# Patient Record
Sex: Female | Born: 1970 | Race: Black or African American | Hispanic: No | Marital: Single | Smoking: Never smoker
Health system: Southern US, Community
[De-identification: ages and names within clinical notes are randomized; demographics above are authoritative.]

## PROBLEM LIST (undated history)

## (undated) DIAGNOSIS — Z5189 Encounter for other specified aftercare: Secondary | ICD-10-CM

## (undated) DIAGNOSIS — I10 Essential (primary) hypertension: Secondary | ICD-10-CM

---

## 1990-11-07 DIAGNOSIS — Z5189 Encounter for other specified aftercare: Secondary | ICD-10-CM

## 1990-11-07 HISTORY — PX: DILATION AND CURETTAGE OF UTERUS: SHX78

## 1990-11-07 HISTORY — DX: Encounter for other specified aftercare: Z51.89

## 1998-04-16 ENCOUNTER — Emergency Department (HOSPITAL_COMMUNITY): Admission: EM | Admit: 1998-04-16 | Discharge: 1998-04-16 | Payer: Self-pay | Admitting: Emergency Medicine

## 1998-06-30 ENCOUNTER — Emergency Department (HOSPITAL_COMMUNITY): Admission: EM | Admit: 1998-06-30 | Discharge: 1998-06-30 | Payer: Self-pay | Admitting: Emergency Medicine

## 1999-04-06 ENCOUNTER — Emergency Department (HOSPITAL_COMMUNITY): Admission: EM | Admit: 1999-04-06 | Discharge: 1999-04-06 | Payer: Self-pay | Admitting: Emergency Medicine

## 1999-06-03 ENCOUNTER — Other Ambulatory Visit: Admission: RE | Admit: 1999-06-03 | Discharge: 1999-06-03 | Payer: Self-pay | Admitting: Obstetrics

## 1999-11-07 ENCOUNTER — Inpatient Hospital Stay (HOSPITAL_COMMUNITY): Admission: AD | Admit: 1999-11-07 | Discharge: 1999-11-07 | Payer: Self-pay | Admitting: Obstetrics

## 2000-01-12 ENCOUNTER — Inpatient Hospital Stay (HOSPITAL_COMMUNITY): Admission: AD | Admit: 2000-01-12 | Discharge: 2000-01-15 | Payer: Self-pay | Admitting: Obstetrics

## 2000-01-12 ENCOUNTER — Encounter (INDEPENDENT_AMBULATORY_CARE_PROVIDER_SITE_OTHER): Payer: Self-pay | Admitting: Specialist

## 2003-09-01 ENCOUNTER — Other Ambulatory Visit: Admission: RE | Admit: 2003-09-01 | Discharge: 2003-09-01 | Payer: Self-pay | Admitting: Gynecology

## 2009-02-16 ENCOUNTER — Emergency Department: Payer: Self-pay | Admitting: Emergency Medicine

## 2009-02-20 ENCOUNTER — Emergency Department (HOSPITAL_COMMUNITY): Admission: EM | Admit: 2009-02-20 | Discharge: 2009-02-20 | Payer: Self-pay | Admitting: Family Medicine

## 2009-06-23 ENCOUNTER — Other Ambulatory Visit: Admission: RE | Admit: 2009-06-23 | Discharge: 2009-06-23 | Payer: Self-pay | Admitting: Obstetrics and Gynecology

## 2011-03-25 NOTE — H&P (Signed)
[facility] [place]  Patient:    SHARLETTE, JANSMA                        MRN: [id number] Adm. Date:  [id number] Attending:  Venita Sheffield                         History and Physical  HISTORY OF PRESENT ILLNESS:   The patient is a 40 year old gravida 4, para 2-0-1-2. She had a previous ectopic and had two previous cesarean sections.  Her EDC was  January 26, 2000 and she was admitted in early labor for repeat C-section and tubal ligation.  PHYSICAL EXAMINATION:  GENERAL:                      Physical exam revealed a well-developed female in  early labor.  HEENT:                        Negative.  LUNGS:                        Clear.  HEART:                        Regular rhythm.  No murmurs.  No gallops.  ABDOMEN:                      Term-size uterus.  Fetal heart 140.  PELVIC:                       The cervix was 1-cm long, vertex -3.  EXTREMITIES:                  Negative. DD:  01/13/00 TD:  01/13/00 Job: [id number]

## 2011-03-25 NOTE — Op Note (Signed)
[facility]  Patient:    Debra Vargas, Debra Vargas                        MRN: [id number] Proc. Date: 01/13/00 Adm. Date:  [id number] Attending:  Venita Sheffield                           Operative Report  PREOPERATIVE DIAGNOSES:       Previous cesarean section x 2; multiparity; at term, in labor, desiring repeat C-section and tubal ligation.   POSTOPERATIVE DIAGNOSES:  OPERATION:  SURGEON:                      Kathreen Cosier, M.D.  ANESTHESIA:                   Spinal.  DESCRIPTION OF PROCEDURE:     Patient was placed on the operating table in supine position after the spinal had been administered by Dr. Shela Commons. Leilani Able, Montez Hageman. Abdomen was prepped and draped and bladder emptied with a Foley catheter. Transverse suprapubic incision was made through the old scar, carried down to the rectus fascia, fascia cleaned and incised the length of the incision, rectus muscles retracted laterally and peritoneum incised longitudinally.  A transverse incision was made in the visceroperitoneum and the bladder mobilized inferiorly. Transverse lower uterine incision was made; the fluid was clear.  The patient was delivered from the LOA position of a female, Apgar 8/9, weighing 7 pounds 12 ounces.  The team was in attendance.  The placenta was anterior and removed manually.  Uterine cavity was cleaned with dry laps.  The uterine incision was closed in one layer with a continuous suture of #1 chromic interlocking; hemostasis was satisfactory.  Bladder flap was reattached with 2-0 chromic.  The ovaries were normal.  The right tube was present.  The left tube was noted to be absent.  In the midportion of the right tube, tube was grasped with a Babcock clamp and a 0 plain suture placed in the mesosalpinx below the portion of tube within the clamp. This was tied on both sides and approximately one inch of tube transected. Hemostasis was satisfactory.   Lap and sponge counts were correct.  Abdomen was closed in layers:  Peritoneum -- a continuous suture of 0 chromic, fascia -- continuous suture of 0 Dexon, and the skin closed with a subcuticular stitch of 3-0 plain.  Blood loss was 500 cc.  The patient tolerated the procedure well and taken to the recovery room in good condition. DD:  01/13/00 TD:  01/14/00 Job: [id number]

## 2011-03-25 NOTE — Discharge Summary (Signed)
[facility] [place]  Patient:    Debra Vargas, Debra Vargas                        MRN: [id number] Adm. Date:  [id number] Disc. Date: [id number] Attending:  Venita Sheffield                           Discharge Summary  HISTORY OF PRESENT ILLNESS:   The patient is a 40 year old, gravida 4, para 2-0-1-2, who had two previous C-sections and an ectopic pregnancy in the past. The [facility] was January 26, 2000, and she was admitted in early labor.  It was decided that she would undergo a repeat C-section and tubal ligation.  HOSPITAL COURSE:              Using spinal anesthesia, the patient underwent a repeat low transverse cesarean section.  She had a female with Apgars of 8 and , weighing 7 pounds 12 ounces.  She had a right tubal pregnancy and a tubal ligation was performed on that side.  The left tube was noted to be absent. Postoperatively, she did well.  Her hemoglobin was 9.8.  DISPOSITION:                  She was discharged home on the secondary postoperative day, ambulatory and on a regular diet.  To see me in six weeks.  DISCHARGE DIAGNOSES:          1. Repeat low transverse cesarean section at term in                                  labor.                               2. Multiparity with tubal ligation. DD:  01/15/00 TD:  01/16/00 Job: [id number]

## 2012-11-07 HISTORY — PX: NOVASURE ABLATION: SHX5394

## 2012-11-28 ENCOUNTER — Other Ambulatory Visit: Payer: Self-pay | Admitting: Obstetrics and Gynecology

## 2012-11-28 DIAGNOSIS — N6310 Unspecified lump in the right breast, unspecified quadrant: Secondary | ICD-10-CM

## 2012-12-17 ENCOUNTER — Ambulatory Visit
Admission: RE | Admit: 2012-12-17 | Discharge: 2012-12-17 | Disposition: A | Discharge status: Home / Self Care | Payer: Commercial Managed Care - PPO | Source: Ambulatory Visit | Attending: Obstetrics and Gynecology | Admitting: Obstetrics and Gynecology

## 2012-12-17 DIAGNOSIS — N6310 Unspecified lump in the right breast, unspecified quadrant: Secondary | ICD-10-CM

## 2013-02-09 ENCOUNTER — Inpatient Hospital Stay (HOSPITAL_COMMUNITY)
Admission: EM | Admit: 2013-02-09 | Discharge: 2013-02-10 | Disposition: A | Discharge status: Home / Self Care | Payer: Commercial Managed Care - PPO | Attending: Obstetrics and Gynecology | Admitting: Obstetrics and Gynecology

## 2013-02-09 DIAGNOSIS — N898 Other specified noninflammatory disorders of vagina: Secondary | ICD-10-CM

## 2013-02-09 DIAGNOSIS — N938 Other specified abnormal uterine and vaginal bleeding: Secondary | ICD-10-CM | POA: Insufficient documentation

## 2013-02-09 DIAGNOSIS — N949 Unspecified condition associated with female genital organs and menstrual cycle: Secondary | ICD-10-CM | POA: Insufficient documentation

## 2013-02-09 DIAGNOSIS — R109 Unspecified abdominal pain: Secondary | ICD-10-CM | POA: Insufficient documentation

## 2013-02-09 DIAGNOSIS — N939 Abnormal uterine and vaginal bleeding, unspecified: Secondary | ICD-10-CM

## 2013-02-09 HISTORY — DX: Encounter for other specified aftercare: Z51.89

## 2013-02-09 LAB — CBC
Hemoglobin: 9.3 g/dL — ABNORMAL LOW (ref 12.0–15.0)
MCH: 21.1 pg — ABNORMAL LOW (ref 26.0–34.0)
MCV: 71 fL — ABNORMAL LOW (ref 78.0–100.0)
RBC: 4.41 MIL/uL (ref 3.87–5.11)

## 2013-02-09 MED ORDER — DEXTROSE 5 % IV SOLN
500.0000 mg | Freq: Once | INTRAVENOUS | Status: AC
  Administered 2013-02-09: 500 mg via INTRAVENOUS
  Filled 2013-02-09: qty 500

## 2013-02-09 MED ORDER — DEXTROSE 5 % IV SOLN
1.0000 g | Freq: Once | INTRAVENOUS | Status: AC
  Administered 2013-02-09: 1 g via INTRAVENOUS
  Filled 2013-02-09: qty 10

## 2013-02-09 MED ORDER — FENTANYL CITRATE 0.05 MG/ML IJ SOLN
100.0000 ug | Freq: Once | INTRAMUSCULAR | Status: AC
  Administered 2013-02-09: 100 ug via INTRAVENOUS
  Filled 2013-02-09: qty 2

## 2013-02-09 NOTE — ED Notes (Signed)
Pt states she had removal of her uterine lining 3 weeks ago and that last night she started cramping severely and today she is bleeding clots the size of her fist and is tearful during triage assessment,  Pt brought in by daughter,

## 2013-02-09 NOTE — ED Provider Notes (Signed)
History     CSN: [id number]  Arrival date & time 02/09/13  1935   None     Chief Complaint  Patient presents with  . Abdominal Pain  . Vaginal Bleeding    (Consider location/radiation/quality/duration/timing/severity/associated sxs/prior treatment) HPI Patient reports lower abdominal cramps that started last night and heavy vaginal bleeding with fist-sized clots starting today.  On 01/21/13 she had a NovaSure procedure done by Dr. Ellyn Hack of [facility] for heavy periods.  States that the current bleeding is much worse than what she had previously and she never had cramps this severe.  States LMP was around the time of the procedure.  She was doing well until yesterday.  Started having some dizziness a hour or so ago, but thinks this might be from the Norco she took at 1800.  Has also taken ibuprofen 600mg  with no improvement.  Denies chest pain or shortness of breath.  No nausea, vomiting, diarrhea.    No past medical history on file.  No past surgical history on file.  No family history on file.  History  Substance Use Topics  . Smoking status: Not on file  . Smokeless tobacco: Not on file  . Alcohol Use: Not on file    OB History   No data available      Review of Systems  Constitutional: Negative.   HENT: Negative.   Respiratory: Negative for shortness of breath.   Cardiovascular: Negative for chest pain.  Gastrointestinal: Positive for abdominal pain (lower abdominal cramping). Negative for nausea, vomiting and diarrhea.  Genitourinary: Positive for vaginal bleeding and pelvic pain.  Skin: Negative for color change.  Neurological: Positive for light-headedness.    Allergies  Review of patient's allergies indicates no known allergies.  Home Medications  No current outpatient prescriptions on file.  BP 145/79  Pulse 103  Temp(Src) 99.2 F (37.3 C) (Oral)  Resp 18  Ht 5\' 1"  (1.549 m)  Wt 178 lb (80.74 kg)  BMI 33.65 kg/m2  SpO2 100%  Physical Exam   Constitutional: She is oriented to person, place, and time. She appears well-developed and well-nourished. She appears distressed.  HENT:  Head: Normocephalic and atraumatic.  Eyes: Conjunctivae are normal.  Neck: Neck supple.  Cardiovascular: Regular rhythm, S1 normal, S2 normal and intact distal pulses.  Tachycardia present.   No murmur heard. Pulmonary/Chest: Effort normal and breath sounds normal. No respiratory distress. She has no wheezes. She has no rales.  Abdominal: Soft. Bowel sounds are normal. She exhibits no distension and no mass. There is tenderness (lower quadrants). There is no rebound and no guarding.  Genitourinary: There is no rash on the right labia. There is no rash on the left labia. Uterus is enlarged and tender. Cervix exhibits motion tenderness (very mild) and discharge (active bleeding). Right adnexum displays no tenderness. Left adnexum displays no tenderness. There is bleeding (vaginal vault full of blood with multiple large clots) around the vagina. No signs of injury around the vagina.  Neurological: She is alert and oriented to person, place, and time. She exhibits normal muscle tone.  Skin: Skin is warm. She is diaphoretic (very mild).    ED Course  Procedures (including critical care time)  Labs Reviewed  CBC - Abnormal; Notable for the following:    WBC 22.5 (*)    Hemoglobin 9.3 (*)    HCT 31.3 (*)    MCV 71.0 (*)    MCH 21.1 (*)    MCHC 29.7 (*)    RDW  17.4 (*)    All other components within normal limits   No results found.   No diagnosis found.    MDM  42 yo otherwise healthy woman presenting with lower abdominal cramping and vaginal bleeding, s/p endometrial ablation 3 weeks ago.  Hemodynamically stable and afebrile.  Will check urine pregnancy and cbc.    White count elevated to 22,000 and Hgb at 9.3 (no other value to compare too).  Pelvic exam shows significant bleeding and uterine tenderness.  Discussed with Dr. Jackelyn Knife on the  phone who recommended starting PO doxycycline and hydration.  After further discussion, will give rocephin 1g and azithromycin 500mg  IV and transfer to [facility] for further evaluation by OB/GYN.          Phebe Colla, MD 02/09/13 2147

## 2013-02-09 NOTE — ED Provider Notes (Signed)
I saw  the patient, reviewed the resident's note and I agree with the findings and plan.   .Face to face Exam:  General:  Awake HEENT:  Atraumatic Resp:  Normal effort Abd:  Nondistended Neuro:No focal weakness   Nelia Shi, MD 02/09/13 2148

## 2013-02-10 ENCOUNTER — Encounter (HOSPITAL_COMMUNITY): Payer: Self-pay

## 2013-02-10 MED ORDER — DOXYCYCLINE HYCLATE 100 MG PO CAPS: 100.0000 mg | ORAL_CAPSULE | Freq: Two times a day (BID) | ORAL | Status: DC

## 2013-02-10 NOTE — MAU Provider Note (Signed)
  History     CSN: [id number]  Arrival date and time: 02/09/13 4098   First Provider Initiated Contact with Patient 02/10/13 [phone]      Chief Complaint  Patient presents with  . Abdominal Pain  . Vaginal Bleeding   HPI  Pt is here after evaluation at [facility] for vaginal bleeding s/p [place] sure procedure.  Pt states feels better since IV fluids and bleeding has improved.  Evaluate pt per Dr. Jackelyn Knife.    Past Medical History  Diagnosis Date  . Blood transfusion without reported diagnosis 1992    after D&C for SAB    Past Surgical History  Procedure Laterality Date  . Novasure ablation  2014  . Dilation and curettage of uterus  1992    History reviewed. No pertinent family history.  History  Substance Use Topics  . Smoking status: Never Smoker   . Smokeless tobacco: Not on file  . Alcohol Use: Yes    Allergies: No Known Allergies  Prescriptions prior to admission  Medication Sig Dispense Refill  . HYDROcodone-acetaminophen (NORCO/VICODIN) 5-325 MG per tablet Take 1 tablet by mouth every 6 (six) hours as needed for pain.      Marland Kitchen ibuprofen (ADVIL,MOTRIN) 800 MG tablet Take 800 mg by mouth every 8 (eight) hours as needed for pain.        Review of Systems  Constitutional: Negative for fever and chills.  Cardiovascular: Negative.   Gastrointestinal: Positive for abdominal pain (cramping).  Genitourinary: Negative.        Vaginal bleeding  Neurological: Negative for dizziness and headaches.   Physical Exam   Blood pressure 124/66, pulse 97, temperature 98.5 F (36.9 C), temperature source Oral, resp. rate 16, height 5\' 1"  (1.549 m), weight 178 lb (80.74 kg), last menstrual period 01/09/2013, SpO2 99.00%.  Physical Exam  Constitutional: She is oriented to person, place, and time. She appears well-developed and well-nourished. No distress.  HENT:  Head: Normocephalic.  Neck: Normal range of motion. Neck supple.  Cardiovascular: Normal rate, regular rhythm and  normal heart sounds.   Respiratory: Effort normal and breath sounds normal.  GI: Soft. She exhibits no mass. There is tenderness (mild). There is no guarding.  Genitourinary: There is bleeding (negative clots; moderate, no brisk bleeding) around the vagina.  Musculoskeletal: Normal range of motion.  Neurological: She is alert and oriented to person, place, and time. She has normal reflexes.  Skin: Skin is warm and dry.    MAU Course  Procedures Consulted with Dr. Jackelyn Knife > provide reassurance to pt, RX Doxycycline, may take OTC iron and follow-up with Dr. Ellyn Hack Monday, no further imaging indicated.    Assessment and Plan  Vaginal Bleeding - Stable  Plan: DC to home RX Doxycycline Follow-up on Monday with Dr. Ellyn Hack Call the office if bleeding and pain worsens.    [facility] 02/10/2013, 12:34 AM

## 2013-02-11 LAB — GC/CHLAMYDIA PROBE AMP
CT Probe RNA: NEGATIVE
GC Probe RNA: NEGATIVE

## 2013-06-05 ENCOUNTER — Emergency Department (HOSPITAL_COMMUNITY)
Admission: EM | Admit: 2013-06-05 | Discharge: 2013-06-05 | Disposition: A | Discharge status: Home / Self Care | Payer: Self-pay | Attending: Emergency Medicine | Admitting: Emergency Medicine

## 2013-06-05 DIAGNOSIS — I10 Essential (primary) hypertension: Secondary | ICD-10-CM

## 2013-06-05 DIAGNOSIS — R6883 Chills (without fever): Secondary | ICD-10-CM | POA: Insufficient documentation

## 2013-06-05 DIAGNOSIS — J3489 Other specified disorders of nose and nasal sinuses: Secondary | ICD-10-CM | POA: Insufficient documentation

## 2013-06-05 DIAGNOSIS — R509 Fever, unspecified: Secondary | ICD-10-CM | POA: Insufficient documentation

## 2013-06-05 DIAGNOSIS — B349 Viral infection, unspecified: Secondary | ICD-10-CM

## 2013-06-05 DIAGNOSIS — J069 Acute upper respiratory infection, unspecified: Secondary | ICD-10-CM

## 2013-06-05 DIAGNOSIS — R490 Dysphonia: Secondary | ICD-10-CM | POA: Insufficient documentation

## 2013-06-05 DIAGNOSIS — B9789 Other viral agents as the cause of diseases classified elsewhere: Secondary | ICD-10-CM | POA: Insufficient documentation

## 2013-06-05 LAB — RAPID STREP SCREEN (MED CTR MEBANE ONLY): Streptococcus, Group A Screen (Direct): NEGATIVE

## 2013-06-05 MED ORDER — HYDROCHLOROTHIAZIDE 25 MG PO TABS: 12.5000 mg | ORAL_TABLET | Freq: Every day | ORAL | Status: DC

## 2013-06-05 MED ORDER — ACETAMINOPHEN 325 MG PO TABS
650.0000 mg | ORAL_TABLET | Freq: Once | ORAL | Status: AC
  Administered 2013-06-05: 650 mg via ORAL
  Filled 2013-06-05: qty 2

## 2013-06-05 NOTE — ED Provider Notes (Signed)
CSN: [id number]     Arrival date & time 06/05/13  1604 History    This chart was scribed for Rolan Bucco, MD by Quintella Reichert, ED scribe.  This patient was seen in room WTR9/WTR9 and the patient's care was started at 4:35 PM.     Chief Complaint  Patient presents with  . Sore Throat  . Cough    The history is provided by the patient. No language interpreter was used.    HPI Comments: Debra Vargas is a 42 y.o. female who presents to the Emergency Department complaining of a progressively-worsening sore throat that began one week ago, with accompanying dry cough.  Pt states that she experiences a burning pain when she coughs.  When she does not cough she states "it doesn't hurt but it feels like it's on fire."  Pain is not greatly exacerbated by swallowing and she denies difficulty swallowing.  She notes that cough drops did provide some relief earlier but have since stopped being effective.  She has not taken any pain medication pta.  She also notes that her voice has been slightly raspy and complains of intermittent chills and some mild congestion in her ears and nose.  Upon arrival she presents with a temperature of 100 F.  She denies throat or neck swelling, difficulty breathing, CP, SOB, neck pain, or neck stiffness.  She denies recent sick contact.   Past Medical History  Diagnosis Date  . Blood transfusion without reported diagnosis 1992    after D&C for SAB    Past Surgical History  Procedure Laterality Date  . Novasure ablation  2014  . Dilation and curettage of uterus  1992    No family history on file.   History  Substance Use Topics  . Smoking status: Never Smoker   . Smokeless tobacco: Not on file  . Alcohol Use: Yes    OB History   Grav Para Term Preterm Abortions TAB SAB Ect Mult Living   4 3 3  1  1   3        Review of Systems  Constitutional: Positive for chills.  HENT: Positive for congestion, sore throat and voice change. Negative for facial  swelling, trouble swallowing, neck pain and neck stiffness.   Respiratory: Positive for cough. Negative for shortness of breath.   Cardiovascular: Negative for chest pain.  All other systems reviewed and are negative.      Allergies  Review of patient's allergies indicates no known allergies.  Home Medications   Current Outpatient Rx  Name  Route  Sig  Dispense  Refill  . doxycycline (VIBRAMYCIN) 100 MG capsule   Oral   Take 1 capsule (100 mg total) by mouth 2 (two) times daily.   14 capsule   0   . hydrochlorothiazide (HYDRODIURIL) 25 MG tablet   Oral   Take 0.5 tablets (12.5 mg total) by mouth daily.   15 tablet   0   . HYDROcodone-acetaminophen (NORCO/VICODIN) 5-325 MG per tablet   Oral   Take 1 tablet by mouth every 6 (six) hours as needed for pain.         Marland Kitchen ibuprofen (ADVIL,MOTRIN) 800 MG tablet   Oral   Take 800 mg by mouth every 8 (eight) hours as needed for pain.          BP 164/97  Pulse 106  Temp(Src) 100 F (37.8 C) (Oral)  SpO2 100%  Physical Exam  Nursing note and vitals reviewed. Constitutional: She  is oriented to person, place, and time. She appears well-developed and well-nourished. No distress.  HENT:  Head: Normocephalic and atraumatic.  Right Ear: Tympanic membrane and external ear normal.  Left Ear: Tympanic membrane and external ear normal.  Mouth/Throat: Uvula is midline and mucous membranes are normal. Posterior oropharyngeal erythema (Mild) present. No oropharyngeal exudate.  Negative facial swelling noted Negative pain upon palpation to the facial pain   Uvula midline, symmetrical elevation Negative petechiae, exudate noted to posterior oropharynx and tonsils bilaterally  Eyes: Conjunctivae and EOM are normal. Pupils are equal, round, and reactive to light. Right eye exhibits no discharge. Left eye exhibits no discharge.  Neck: Normal range of motion. Neck supple. No tracheal deviation present.  Negative neck stiffness Negative  nuchal rigidity Negative mid-cervical spine tenderness Negative LAD Negative masses palpated  Cardiovascular: Normal rate, regular rhythm and normal heart sounds.   No murmur heard. Pulses:      Radial pulses are 2+ on the right side, and 2+ on the left side.  Pulmonary/Chest: Effort normal and breath sounds normal. No respiratory distress. She has no wheezes. She has no rales.  Musculoskeletal: Normal range of motion.  Lymphadenopathy:    She has no cervical adenopathy.  Neurological: She is alert and oriented to person, place, and time. She exhibits normal muscle tone. Coordination normal.  Skin: Skin is warm and dry. No rash noted. No erythema.  Psychiatric: She has a normal mood and affect. Her behavior is normal.    ED Course  Procedures (including critical care time)  DIAGNOSTIC STUDIES: Oxygen Saturation is 100% on room air, normal by my interpretation.    COORDINATION OF CARE: 4:43 PM-Discussed treatment plan which includes strep screen with pt at bedside and pt agreed to plan.   Patient reported that she has had borderline elevated blood pressure, but was never started on medication.   Labs Reviewed  RAPID STREP SCREEN  CULTURE, GROUP A STREP    No results found.  1. Viral illness   2. URI (upper respiratory infection)   3. HTN (hypertension)      MDM  I personally performed the services described in this documentation, which was scribed in my presence. The recorded information has been reviewed and is accurate.  Patient presenting to the ED with sore throat and dry cough with mild nasal congestion that has been ongoing for the past week.  Mild erythema noted to the posterior oropharynx - negative exudate, petchiae, and tonsillar swelling noted. Doubt peritonsillar abscess. Negative masses palpated to the neck, negative LAD. Rapid strep negative. Negative meningeal signs - doubt meningitis. Suspicion to be viral in nature. Blood pressure monitored in ED setting -  increased diastolic, patient asymptomatic - denied blurred vision, headache, nausea, tingling, numbness. Patient stable, fever controlled in ED setting. Suspicion to be viral illness in nature. Discharged patient - discussed supportive therapy. Discharged patient with small dose HCTZ 12.5 mg - discussed with patient to take medication daily and discussed side affects to discontinue. Recommended patient to get blood pressure re-[facility] - gave referral. Discussed with patient DASH diet. Discussed with patient to continue to monitor symptoms and if symptoms are to worsen or change to report back to the ED - strict return instructions given. Patient agreed to plan of care, understood, all questions answered.   Raymon Mutton, PA-C 06/05/13 2200

## 2013-06-05 NOTE — ED Provider Notes (Signed)
Medical screening examination/treatment/procedure(s) were performed by non-physician practitioner and as supervising physician I was immediately available for consultation/collaboration.   Rolan Bucco, MD 06/05/13 [phone]

## 2013-06-05 NOTE — ED Notes (Addendum)
Marissa, PA notified of high BP.

## 2013-06-05 NOTE — ED Notes (Signed)
Pt states that she has had a sore throat and nonproductive cough x 1 wk. Thought it was getting better and it came back.

## 2013-06-07 LAB — CULTURE, GROUP A STREP

## 2013-06-17 ENCOUNTER — Encounter: Payer: Self-pay | Admitting: Internal Medicine

## 2013-06-17 ENCOUNTER — Ambulatory Visit: Payer: Self-pay | Attending: Family Medicine | Admitting: Internal Medicine

## 2013-06-17 VITALS — BP 143/86 | HR 79 | Temp 98.8°F | Ht 61.0 in | Wt 185.8 lb

## 2013-06-17 DIAGNOSIS — J329 Chronic sinusitis, unspecified: Secondary | ICD-10-CM | POA: Insufficient documentation

## 2013-06-17 DIAGNOSIS — I10 Essential (primary) hypertension: Secondary | ICD-10-CM | POA: Insufficient documentation

## 2013-06-17 LAB — LIPID PANEL: HDL: 47 mg/dL (ref >39)

## 2013-06-17 LAB — HEMOGLOBIN A1C
Hgb A1c MFr Bld: 5.5 % (ref <5.7)
Mean Plasma Glucose: 111 mg/dL (ref <117)

## 2013-06-17 LAB — CBC WITH DIFFERENTIAL/PLATELET
Basophils Absolute: 0 10*3/uL (ref 0.0–0.1)
Basophils Relative: 0 % (ref 0–1)
Eosinophils Absolute: 0.2 10*3/uL (ref 0.0–0.7)
Eosinophils Relative: 2 % (ref 0–5)
HCT: 33.1 % — ABNORMAL LOW (ref 36.0–46.0)
Lymphocytes Relative: 29 % (ref 12–46)
MCH: 22.3 pg — ABNORMAL LOW (ref 26.0–34.0)
MCHC: 31.7 g/dL (ref 30.0–36.0)
MCV: 70.4 fL — ABNORMAL LOW (ref 78.0–100.0)
Monocytes Absolute: 0.6 10*3/uL (ref 0.1–1.0)
RDW: 20.6 % — ABNORMAL HIGH (ref 11.5–15.5)

## 2013-06-17 MED ORDER — FLUTICASONE PROPIONATE 50 MCG/ACT NA SUSP: 2.0000 | Freq: Every day | NASAL | Status: DC

## 2013-06-17 NOTE — Progress Notes (Signed)
Patient ID: JARROD BODKINS, female   DOB: 07/26/1971, 42 y.o.   MRN: [id number] Patient Demographics  Debra Vargas, is a 42 y.o. female  NWG:[id number]  VHQ:[id number]  DOB - 11-Nov-1970  Chief Complaint  Patient presents with  . Establish Care  . Hypertension        Subjective:   Debra Vargas today is here to establish primary care. She was seen in the ER recently for chronic sinusitis, she was discovered to have high blood pressure she was referred to the clinic for follow up. She has no past medical history is significant except for sinus pressure is chronic, no fever. She's not on any chronic medication except for over-the-counter vitamins. She does not smoke cigarette, she does not drink alcohol. There is history of hypertension and her parents, a heart attack in grandmother at age of 36, no family history of diabetes, no family history of cancer. She had a Pap smear done about 4 months ago and breast exam at the same time all normal. Patient has No headache, No chest pain, No abdominal pain - No Nausea, No new weakness tingling or numbness, No Cough - SOB.  She was prescribed 12.5 mg daily hydrochlorothiazide from the ER, but she has not used it for more than a week because she thinks she may not need it. Objective:    Filed Vitals:   06/17/13 1434  BP: 143/86  Pulse: 79  Temp: 98.8 F (37.1 C)  TempSrc: Oral  Height: 5\' 1"  (1.549 m)  Weight: 185 lb 12.8 oz (84.278 kg)  SpO2: 100%     ALLERGIES:  No Known Allergies  PAST MEDICAL HISTORY: Past Medical History  Diagnosis Date  . Blood transfusion without reported diagnosis 1992    after D&C for SAB    PAST SURGICAL HISTORY: Past Surgical History  Procedure Laterality Date  . Novasure ablation  2014  . Dilation and curettage of uterus  1992    FAMILY HISTORY: No family history on file.  MEDICATIONS AT HOME: Prior to Admission medications   Medication Sig Start Date End Date Taking? Authorizing Provider   hydrochlorothiazide (HYDRODIURIL) 25 MG tablet Take 0.5 tablets (12.5 mg total) by mouth daily. 06/05/13  Yes Marissa Sciacca, PA-C  fluticasone (FLONASE) 50 MCG/ACT nasal spray Place 2 sprays into the nose daily. 06/17/13   Jeanann Lewandowsky, MD    REVIEW OF SYSTEMS:  Constitutional:   No   Fevers, chills, fatigue.  HEENT:    No headaches, Sore throat,   Cardio-vascular: No chest pain,  Orthopnea, swelling in lower extremities, anasarca, palpitations  GI:  No abdominal pain, nausea, vomiting, diarrhea  Resp: No shortness of breath,  No coughing up of blood.No cough.No wheezing.  Skin:  no rash or lesions.  GU:  no dysuria, change in color of urine, no urgency or frequency.  No flank pain.  Musculoskeletal: No joint pain or swelling.  No decreased range of motion.  No back pain.  Psych: No change in mood or affect. No depression or anxiety.  No memory loss.   Exam  General appearance :Awake, alert, NAD, Speech Clear. HEENT: Atraumatic and Normocephalic, PERLA Neck: supple, no JVD. No cervical lymphadenopathy.  Chest: clear to auscultation bilaterally, no wheezing, rales or rhonchi CVS: S1 S2 regular, no murmurs.  Abdomen: soft, NBS, NT, ND, no gaurding, rigidity or rebound. Extremities: No cyanosis, clubbing, B/L Lower Ext shows no edema,  Neurology: Awake alert, and oriented X 3, CN II-XII intact, Non focal Skin:No Rash  or lesions Wounds: N/A   Assessment & Plan    High Blood Pressure  Chronic Sinusitis   Recommendations:  Patient is encouraged to record ambulatory blood pressures and bring back to clinic in one week, then we will decide if she needs an antihypertensive  She's encouraged on regular exercise  Low-salt diet  Counseled on nutrition    Flonase nasal spray is prescribed for her sinuses    Follow-up in one week for blood pressure check  Debra Vargas was given clear instructions to go to ER or return to the clinic if symptoms don't  improve, worsen or new problems develop.  Debra Vargas verbalized understanding.  Debra Vargas was told to call to get lab results if hasn't heard anything in the next week.    Jeanann Lewandowsky M.D. 06/17/2013, 2:57 PM

## 2013-06-18 ENCOUNTER — Telehealth: Payer: Self-pay | Admitting: *Deleted

## 2013-06-18 LAB — COMPLETE METABOLIC PANEL WITH GFR
AST: 14 U/L (ref 0–37)
BUN: 9 mg/dL (ref 6–23)
Calcium: 8.9 mg/dL (ref 8.4–10.5)
Chloride: 105 mEq/L (ref 96–112)
Creat: 0.66 mg/dL (ref 0.50–1.10)
Total Bilirubin: 0.2 mg/dL — ABNORMAL LOW (ref 0.3–1.2)

## 2013-06-18 NOTE — Telephone Encounter (Signed)
06/18/13 Spoke with patient made aware of lab results Please call patient, inform her that her hemoglobin level is slightly low most likely from iron deficiency. She needs to be on iron supplement if she is not already. We can call in script for Iron sulfate. Please inform her that her HbA1C is normal and she is not at risk for diabetes at the moment.  Patient  stated that she already have iron supplement and will start back taking them today. P.Lula Michaux,RN BSN M[facility]

## 2013-06-24 ENCOUNTER — Ambulatory Visit: Payer: Self-pay

## 2013-10-27 ENCOUNTER — Emergency Department (HOSPITAL_COMMUNITY): Payer: Self-pay

## 2013-10-27 ENCOUNTER — Encounter (HOSPITAL_COMMUNITY): Payer: Self-pay | Admitting: Emergency Medicine

## 2013-10-27 ENCOUNTER — Emergency Department (HOSPITAL_COMMUNITY)
Admission: EM | Admit: 2013-10-27 | Discharge: 2013-10-27 | Disposition: A | Discharge status: Home / Self Care | Payer: Self-pay | Attending: Emergency Medicine | Admitting: Emergency Medicine

## 2013-10-27 DIAGNOSIS — I10 Essential (primary) hypertension: Secondary | ICD-10-CM | POA: Insufficient documentation

## 2013-10-27 DIAGNOSIS — IMO0002 Reserved for concepts with insufficient information to code with codable children: Secondary | ICD-10-CM | POA: Insufficient documentation

## 2013-10-27 DIAGNOSIS — Z79899 Other long term (current) drug therapy: Secondary | ICD-10-CM | POA: Insufficient documentation

## 2013-10-27 DIAGNOSIS — R0602 Shortness of breath: Secondary | ICD-10-CM | POA: Insufficient documentation

## 2013-10-27 DIAGNOSIS — R0789 Other chest pain: Secondary | ICD-10-CM | POA: Insufficient documentation

## 2013-10-27 LAB — BASIC METABOLIC PANEL
BUN: 8 mg/dL (ref 6–23)
CO2: 29 mEq/L (ref 19–32)
Chloride: 101 mEq/L (ref 96–112)
Creatinine, Ser: 0.57 mg/dL (ref 0.50–1.10)

## 2013-10-27 LAB — CBC
HCT: 36.7 % (ref 36.0–46.0)
MCV: 82.7 fL (ref 78.0–100.0)
RBC: 4.44 MIL/uL (ref 3.87–5.11)
WBC: 7.7 10*3/uL (ref 4.0–10.5)

## 2013-10-27 MED ORDER — ONDANSETRON HCL 4 MG/2ML IJ SOLN: 4.0000 mg | Freq: Once | INTRAMUSCULAR | Status: DC

## 2013-10-27 MED ORDER — ASPIRIN 325 MG PO TABS
325.0000 mg | ORAL_TABLET | ORAL | Status: AC
  Administered 2013-10-27: 325 mg via ORAL
  Filled 2013-10-27: qty 1

## 2013-10-27 MED ORDER — NITROGLYCERIN 0.4 MG SL SUBL: 0.4000 mg | SUBLINGUAL_TABLET | SUBLINGUAL | Status: DC | PRN

## 2013-10-27 MED ORDER — MORPHINE SULFATE 4 MG/ML IJ SOLN
4.0000 mg | Freq: Once | INTRAMUSCULAR | Status: AC
  Administered 2013-10-27: 4 mg via INTRAVENOUS
  Filled 2013-10-27: qty 1

## 2013-10-27 NOTE — ED Provider Notes (Signed)
Complains of anterior chest pain and right-sided parasternal nonradiating pleuritic upon awakening 8 AM today. Discomfort is mild described as a tightness, nonexertional. Worse with changing positions i.e. bending forward to tie her shoe laces improve with lying supine. Cardiac risk factors include family history mother had MI in her 23s, hypertension. On exam patient is alert no distress lungs clear auscultation heart regular rate and rhythm no murmurs rubs abdomen obese nontender. Patient is PERC negative. Heart scores equals1. Patient Debra Vargas for outpatient cardiac workup diagnosis atypical chest pain  Doug Sou, MD 10/27/13 Windell Moment

## 2013-10-27 NOTE — ED Notes (Signed)
Patient states that she was awakened by what she thought was gas, but the pain continued throughout the day. She describes the pain as a constant pressure in the middle of her chest. She denies nausea, vomiting, diaphoresis, or radiation to arm or jaw. Patient has never experience this type of pain before.

## 2013-10-27 NOTE — ED Provider Notes (Signed)
CSN: [id number]     Arrival date & time 10/27/13  1601 History   First MD Initiated Contact with Patient 10/27/13 1625     Chief Complaint  Patient presents with  . Chest Pain   (Consider location/radiation/quality/duration/timing/severity/associated sxs/prior Treatment) HPI Comments: Patient is a 42 year old female who presents today after awaking with chest pain. The pain is a tightness and a pressure in her right chest. She reports that she woke up with the pain and it did not wake her up. The pain is worse with certain positions. She believes the pain is worse sitting forward and relieved by lying flat. She took Gas-x today which did not improve her symptoms. She has some shortness of breath with exertion today. The pain has been constant today. She denies any radiation, diaphoresis, nausea, vomiting. She has never had pain like this before. She has never seen a cardiologist. She has a positive family history with both her mother and grandmother having MIs in their 70s.   The history is provided by the patient. No language interpreter was used.    Past Medical History  Diagnosis Date  . Blood transfusion without reported diagnosis 1992    after D&C for SAB   Past Surgical History  Procedure Laterality Date  . Novasure ablation  2014  . Dilation and curettage of uterus  1992   No family history on file. History  Substance Use Topics  . Smoking status: Never Smoker   . Smokeless tobacco: Not on file  . Alcohol Use: Yes   OB History   Grav Para Term Preterm Abortions TAB SAB Ect Mult Living   4 3 3  1  1   3      Review of Systems  Constitutional: Negative for fever, chills and diaphoresis.  Respiratory: Positive for shortness of breath.   Cardiovascular: Positive for chest pain.  Gastrointestinal: Negative for nausea, vomiting and abdominal pain.  All other systems reviewed and are negative.    Allergies  Review of patient's allergies indicates no known  allergies.  Home Medications   Current Outpatient Rx  Name  Route  Sig  Dispense  Refill  . fluticasone (FLONASE) 50 MCG/ACT nasal spray   Nasal   Place 2 sprays into the nose daily.   16 g   2   . hydrochlorothiazide (HYDRODIURIL) 25 MG tablet   Oral   Take 0.5 tablets (12.5 mg total) by mouth daily.   15 tablet   0    BP 159/93  Pulse 100  Temp(Src) 98.8 F (37.1 C) (Oral)  Resp 18  SpO2 100% Physical Exam  Nursing note and vitals reviewed. Constitutional: She is oriented to person, place, and time. She appears well-developed and well-nourished. No distress.  HENT:  Head: Normocephalic and atraumatic.  Right Ear: External ear normal.  Left Ear: External ear normal.  Nose: Nose normal.  Mouth/Throat: Oropharynx is clear and moist.  Eyes: Conjunctivae are normal.  Neck: Normal range of motion.  Cardiovascular: Normal rate, regular rhythm and normal heart sounds.   Pulmonary/Chest: Effort normal and breath sounds normal. No stridor. No respiratory distress. She has no wheezes. She has no rales.  Abdominal: Soft. She exhibits no distension.  Musculoskeletal: Normal range of motion.  Neurological: She is alert and oriented to person, place, and time. She has normal strength.  Skin: Skin is warm and dry. She is not diaphoretic. No erythema.  Psychiatric: She has a normal mood and affect. Her behavior is normal.  ED Course  Procedures (including critical care time) Labs Review Labs Reviewed  BASIC METABOLIC PANEL - Abnormal; Notable for the following:    Potassium 3.2 (*)    Glucose, Bld 111 (*)    All other components within normal limits  CBC  POCT I-STAT TROPONIN I   Imaging Review Dg Chest 2 View  10/27/2013   CLINICAL DATA:  Chest pain.  Short of breath.  EXAM: CHEST  2 VIEW  COMPARISON:  None.  FINDINGS: Prominent pulmonary vessels are seen on and on the lateral view in the infrahilar region. Cardiopericardial silhouette within normal limits. Mediastinal  contours normal. Trachea midline. No airspace disease or effusion. Monitoring leads project over the chest.  IMPRESSION: No active cardiopulmonary disease.   Electronically Signed   By: Andreas Newport M.D.   On: 10/27/2013 18:00    EKG Interpretation    Date/Time:  Sunday October 27 2013 16:13:05 EST Ventricular Rate:  90 PR Interval:  137 QRS Duration: 102 QT Interval:  392 QTC Calculation: 480 R Axis:   92 Text Interpretation:  Sinus rhythm Borderline right axis deviation Baseline wander in lead(s) I II III aVL aVF No old tracing to compare Confirmed by JACUBOWITZ  MD, SAM ([id number]) on 10/27/2013 4:41:14 PM            MDM   1. Hypertension   2. Atypical chest pain    Patient presents with atypical chest pain. Heart score is 1. PERC negative. EKG, labs, and imaging are unremarkable. Patient is appropriate for outpatient followup for further cardiac testing. She understands she needs follow up with PCP for her hypertension. Repeat BP in 3 weeks. Strict return instructions given. Vital signs stable for discharge. Dr. Caprice Kluver evaluated patient and agrees with plan. Patient / Family / Caregiver informed of clinical course, understand medical decision-making process, and agree with plan.     Mora Bellman, PA-C 10/27/13 2153

## 2013-10-28 NOTE — ED Provider Notes (Signed)
Medical screening examination/treatment/procedure(s) were conducted as a shared visit with non-physician practitioner(s) and myself.  I personally evaluated the patient during the encounter.  EKG Interpretation    Date/Time:  Sunday October 27 2013 16:13:05 EST Ventricular Rate:  90 PR Interval:  137 QRS Duration: 102 QT Interval:  392 QTC Calculation: 480 R Axis:   92 Text Interpretation:  Sinus rhythm Borderline right axis deviation Baseline wander in lead(s) I II III aVL aVF No old tracing to compare Confirmed by Ethelda Chick  MD, Akasia Ahmad ([id number]) on 10/27/2013 4:41:14 PM             Doug Sou, MD 10/28/13 0002

## 2014-09-08 ENCOUNTER — Encounter (HOSPITAL_COMMUNITY): Payer: Self-pay | Admitting: Emergency Medicine

## 2015-04-21 ENCOUNTER — Encounter (HOSPITAL_COMMUNITY): Payer: Self-pay

## 2015-04-21 ENCOUNTER — Emergency Department (HOSPITAL_COMMUNITY)
Admission: EM | Admit: 2015-04-21 | Discharge: 2015-04-21 | Disposition: A | Discharge status: Home / Self Care | Payer: Self-pay | Attending: Emergency Medicine | Admitting: Emergency Medicine

## 2015-04-21 DIAGNOSIS — J019 Acute sinusitis, unspecified: Secondary | ICD-10-CM | POA: Insufficient documentation

## 2015-04-21 MED ORDER — OXYMETAZOLINE HCL 0.05 % NA SOLN
1.0000 | Freq: Once | NASAL | Status: AC
  Administered 2015-04-21: 1 via NASAL
  Filled 2015-04-21: qty 15

## 2015-04-21 MED ORDER — KETOROLAC TROMETHAMINE 30 MG/ML IJ SOLN
30.0000 mg | Freq: Once | INTRAMUSCULAR | Status: AC
  Administered 2015-04-21: 30 mg via INTRAVENOUS
  Filled 2015-04-21: qty 1

## 2015-04-21 MED ORDER — SODIUM CHLORIDE 0.9 % IV BOLUS (SEPSIS)
1000.0000 mL | Freq: Once | INTRAVENOUS | Status: AC
  Administered 2015-04-21: 1000 mL via INTRAVENOUS

## 2015-04-21 MED ORDER — METOCLOPRAMIDE HCL 5 MG/ML IJ SOLN
10.0000 mg | Freq: Once | INTRAMUSCULAR | Status: AC
  Administered 2015-04-21: 10 mg via INTRAVENOUS
  Filled 2015-04-21: qty 2

## 2015-04-21 MED ORDER — TRAMADOL HCL 50 MG PO TABS: 50.0000 mg | ORAL_TABLET | Freq: Four times a day (QID) | ORAL | Status: DC | PRN

## 2015-04-21 MED ORDER — AMOXICILLIN-POT CLAVULANATE 500-125 MG PO TABS: 1.0000 | ORAL_TABLET | Freq: Three times a day (TID) | ORAL | Status: DC

## 2015-04-21 NOTE — ED Provider Notes (Signed)
CSN: [id number]     Arrival date & time 04/21/15  9629 History   First MD Initiated Contact with Patient 04/21/15 0935     Chief Complaint  Patient presents with  . Headache     (Consider location/radiation/quality/duration/timing/severity/associated sxs/prior Treatment) HPI    PCP: JEGEDE, OLUGBEMIGA, MD Blood pressure 157/92, pulse 80, temperature 98.2 F (36.8 C), temperature source Oral, resp. rate 16, SpO2 98 %.  Debra Vargas is a 44 y.o.female without any significant PMH presents to the ER with complaints of headache, nasal congestion, left ear pain. Her symptoms started a few days ago, she has been taking over-the-counter sinus medication but has not been helping. She is also felt mildly nauseous.. The patient denies having any symptoms of numbness, tingling, dysphasia, aphasia, vomiting, diarrhea, chest pain, neck pain, weakness that is focal or generalized him a sore throat on the temporal pain, shortness of breath.    Past Medical History  Diagnosis Date  . Blood transfusion without reported diagnosis 1992    after D&C for SAB   Past Surgical History  Procedure Laterality Date  . Novasure ablation  2014  . Dilation and curettage of uterus  1992   No family history on file. History  Substance Use Topics  . Smoking status: Never Smoker   . Smokeless tobacco: Not on file  . Alcohol Use: Yes   OB History    Gravida Para Term Preterm AB TAB SAB Ectopic Multiple Living   Review of Systems  10 Systems reviewed and are negative for acute change except as noted in the HPI.     Allergies  Review of patient's allergies indicates no known allergies.  Home Medications   Prior to Admission medications   Medication Sig Start Date End Date Taking? Authorizing Provider  Chlorpheniramine-Phenylephrine (SINUS & ALLERGY PO) Take 2 tablets by mouth daily as needed (allergies).   Yes Historical Provider, MD   BP 157/92 mmHg  Pulse 80  Temp(Src)  98.2 F (36.8 C) (Oral)  Resp 16  SpO2 98% Physical Exam  Constitutional: She appears well-developed and well-nourished. No distress.  HENT:  Head: Normocephalic and atraumatic. Head is without Battle's sign, without right periorbital erythema and without left periorbital erythema. Hair is normal.  Right Ear: Tympanic membrane and ear canal normal.  Left Ear: Ear canal normal. Tympanic membrane is bulging.  Nose: Rhinorrhea and sinus tenderness present. No epistaxis.  No foreign bodies. Right sinus exhibits no maxillary sinus tenderness and no frontal sinus tenderness. Left sinus exhibits maxillary sinus tenderness and frontal sinus tenderness.  Mouth/Throat: Uvula is midline and oropharynx is clear and moist.  Eyes: Pupils are equal, round, and reactive to light.  Neck: Normal range of motion. Neck supple. No spinous process tenderness and no muscular tenderness present.  Cardiovascular: Normal rate and regular rhythm.   Pulmonary/Chest: Effort normal and breath sounds normal.  Abdominal: Soft.  Neurological: She is alert.  Skin: Skin is warm and dry.  Nursing note and vitals reviewed.   ED Course  Procedures (including critical care time) Labs Review Labs Reviewed - No data to display  Imaging Review No results found.   EKG Interpretation None      MDM   Final diagnoses:  Acute sinusitis, recurrence not specified, unspecified location   Presentation is non concerning for [facility], ICH, Meningitis, or temporal arteritis. Pt is afebrile with no focal neuro deficits, nuchal rigidity, or  change in vision. The patient denies any symptoms of neurological impairment or TIA's; no amaurosis, diplopia, dysphasia, or unilateral disturbance of motor or sensory function. No loss of balance or vertigo.  Patients reports total relief with medications given in the ED. Symptoms consistent with sinus infection, will start on Afrin and Augmentin.  Medications  sodium chloride 0.9 % bolus  1,000 mL (1,000 mLs Intravenous New Bag/Given 04/21/15 1106)  metoCLOPramide (REGLAN) injection 10 mg (not administered)  ketorolac (TORADOL) 30 MG/ML injection 30 mg (30 mg Intravenous Given 04/21/15 1106)  oxymetazoline (AFRIN) 0.05 % nasal spray 1 spray (1 spray Each Nare Given 04/21/15 1107)    43 y.o.[name]'s evaluation in the Emergency Department is complete. It has been determined that no acute conditions requiring further emergency intervention are present at this time. The patient/guardian have been advised of the diagnosis and plan. We have discussed signs and symptoms that warrant return to the ED, such as changes or worsening in symptoms.  Vital signs are stable at discharge. Filed Vitals:   04/21/15 1102  BP: 157/92  Pulse: 80  Temp:   Resp: 16    Patient/guardian has voiced understanding and agreed to follow-up with the PCP or specialist.     Marlon Pel, PA-C 04/21/15 1157  Bethann Berkshire, MD 04/22/15 1725

## 2015-04-21 NOTE — ED Notes (Addendum)
Pt presents with NAD. Neuro intact. Pt reports HA started yesterday. HA left side " in left eye" radiated to back of head. Denies numbness and tingling. C/o of nausea. Denies V/D and fever. Took OTC sinus medications without relief. Elevated BP. "has been high in the past". No DX of HTN. NO meds.

## 2015-04-21 NOTE — Discharge Instructions (Signed)

## 2015-08-20 ENCOUNTER — Encounter (HOSPITAL_COMMUNITY): Payer: Self-pay | Admitting: Emergency Medicine

## 2015-08-20 ENCOUNTER — Emergency Department (HOSPITAL_COMMUNITY)
Admission: EM | Admit: 2015-08-20 | Discharge: 2015-08-20 | Disposition: A | Discharge status: Home / Self Care | Payer: Self-pay | Attending: Emergency Medicine | Admitting: Emergency Medicine

## 2015-08-20 DIAGNOSIS — Z792 Long term (current) use of antibiotics: Secondary | ICD-10-CM | POA: Insufficient documentation

## 2015-08-20 DIAGNOSIS — N390 Urinary tract infection, site not specified: Secondary | ICD-10-CM | POA: Insufficient documentation

## 2015-08-20 LAB — URINALYSIS, ROUTINE W REFLEX MICROSCOPIC
Bilirubin Urine: NEGATIVE
GLUCOSE, UA: NEGATIVE mg/dL
Ketones, ur: NEGATIVE mg/dL
Nitrite: NEGATIVE
PROTEIN: 100 mg/dL — AB
Specific Gravity, Urine: 1.026 (ref 1.005–1.030)
Urobilinogen, UA: 1 mg/dL (ref 0.0–1.0)
pH: 6.5 (ref 5.0–8.0)

## 2015-08-20 LAB — URINE MICROSCOPIC-ADD ON

## 2015-08-20 MED ORDER — CEPHALEXIN 500 MG PO CAPS: 500.0000 mg | ORAL_CAPSULE | Freq: Two times a day (BID) | ORAL | Status: DC

## 2015-08-20 MED ORDER — CEPHALEXIN 500 MG PO CAPS
500.0000 mg | ORAL_CAPSULE | Freq: Once | ORAL | Status: AC
  Administered 2015-08-20: 500 mg via ORAL
  Filled 2015-08-20: qty 1

## 2015-08-20 MED ORDER — PHENAZOPYRIDINE HCL 100 MG PO TABS: 100.0000 mg | ORAL_TABLET | Freq: Three times a day (TID) | ORAL | Status: DC

## 2015-08-20 MED ORDER — PHENAZOPYRIDINE HCL 100 MG PO TABS
100.0000 mg | ORAL_TABLET | Freq: Three times a day (TID) | ORAL | Status: DC
  Administered 2015-08-20: 100 mg via ORAL
  Filled 2015-08-20: qty 1

## 2015-08-20 NOTE — ED Provider Notes (Signed)
CSN: [id number]     Arrival date & time 08/20/15  1836 History  By signing my name below, I, Phillis Haggis, attest that this documentation has been prepared under the direction and in the presence of Earley Favor, NP-C Electronically Signed: Phillis Haggis, ED Scribe. 08/20/2015. 8:17 PM.   Chief Complaint  Patient presents with  . Dysuria   The history is provided by the patient. No language interpreter was used.   HPI Comments: Debra Vargas is a 44 y.o. female who presents to the Emergency Department complaining of midline lower back pain onset 2 days ago. Pt reports associated dysuria, hematuria and cramping abdominal pain. Pt denies having a PCP. Pt reports hx of novasure ablation and does not have regular periods. She denies fever, chills, nausea, vomiting, or vaginal discharge.  Past Medical History  Diagnosis Date  . Blood transfusion without reported diagnosis 1992    after D&C for SAB   Past Surgical History  Procedure Laterality Date  . Novasure ablation  2014  . Dilation and curettage of uterus  1992   History reviewed. No pertinent family history. Social History  Substance Use Topics  . Smoking status: Never Smoker   . Smokeless tobacco: None  . Alcohol Use: Yes   OB History    Gravida Para Term Preterm AB TAB SAB Ectopic Multiple Living   Review of Systems  Constitutional: Negative for fever.  Genitourinary: Positive for dysuria, frequency and hematuria.  Musculoskeletal: Positive for back pain.    Allergies  Review of patient's allergies indicates no known allergies.  Home Medications   Prior to Admission medications   Medication Sig Start Date End Date Taking? Authorizing Provider  amoxicillin-clavulanate (AUGMENTIN) 500-125 MG per tablet Take 1 tablet (500 mg total) by mouth every 8 (eight) hours. 04/21/15   Tiffany Neva Seat, PA-C  cephALEXin (KEFLEX) 500 MG capsule Take 1 capsule (500 mg total) by mouth 2 (two) times daily. 08/20/15    Earley Favor, NP  Chlorpheniramine-Phenylephrine (SINUS & ALLERGY PO) Take 2 tablets by mouth daily as needed (allergies).    Historical Provider, MD  phenazopyridine (PYRIDIUM) 100 MG tablet Take 1 tablet (100 mg total) by mouth 3 (three) times daily with meals. 08/21/15   Earley Favor, NP  traMADol (ULTRAM) 50 MG tablet Take 1 tablet (50 mg total) by mouth every 6 (six) hours as needed. 04/21/15   Tiffany Neva Seat, PA-C   BP 191/99 mmHg  Pulse 97  Temp(Src) 98 F (36.7 C) (Oral) Physical Exam  Constitutional: She is oriented to person, place, and time. She appears well-developed and well-nourished.  HENT:  Head: Normocephalic.  Eyes: Pupils are equal, round, and reactive to light.  Neck: Normal range of motion.  Cardiovascular: Normal rate and regular rhythm.   Pulmonary/Chest: Effort normal and breath sounds normal.  Musculoskeletal: Normal range of motion.  Neurological: She is alert and oriented to person, place, and time.  Skin: Skin is warm and dry.  Nursing note and vitals reviewed.   ED Course  Procedures (including critical care time) COORDINATION OF CARE: 8:10 PM-Discussed treatment plan which includes pyridium, antiand follow up with [facility] and wellness with pt at bedside and pt agreed to plan.    Labs Review Labs Reviewed  URINALYSIS, ROUTINE W REFLEX MICROSCOPIC (NOT AT [facility]) - Abnormal; Notable for the following:    APPearance TURBID (*)    Hgb urine dipstick LARGE (*)  Protein, ur 100 (*)    Leukocytes, UA LARGE (*)    All other components within normal limits  URINE CULTURE  URINE MICROSCOPIC-ADD ON    Imaging Review No results found. I have personally reviewed and evaluated these images and lab results as part of my medical decision-making.   EKG Interpretation None     will treat patient with Pyridium and Keflex for UTI.  Also culture the urine to make sure we are providing the correct antibiotic  MDM   Final diagnoses:  UTI (lower urinary  tract infection)    I personally performed the services described in this documentation, which was scribed in my presence. The recorded information has been reviewed and is accurate.    Earley FavorGail Divinity Kyler, NP 08/20/15 2035  Laurence Spatesachel Morgan Little, MD 08/21/15 [phone]

## 2015-08-20 NOTE — ED Notes (Signed)
Pt reports pain with urination, blood in urine and midline lower back pain for the past 2 days. Denies vaginal discharge.

## 2015-08-20 NOTE — Discharge Instructions (Signed)
°Emergency Department Resource Guide °1) Find a Doctor and Pay Out of Pocket °Although you won't have to find out who is covered by your insurance plan, it is a good idea to ask around and get recommendations. You will then need to call the office and see if the doctor you have chosen will accept you as a new patient and what types of options they offer for patients who are self-pay. Some doctors offer discounts or will set up payment plans for their patients who do not have insurance, but you will need to ask so you aren't surprised when you get to your appointment. ° °2) Contact Your Local Health Department °Not all health departments have doctors that can see patients for sick visits, but many do, so it is worth a call to see if yours does. If you don't know where your local health department is, you can check in your phone book. The CDC also has a tool to help you locate your state's health department, and many state websites also have listings of all of their local health departments. ° °3) Find a Walk-in Clinic °If your illness is not likely to be very severe or complicated, you may want to try a walk in clinic. These are popping up all over the country in pharmacies, drugstores, and shopping centers. They're usually staffed by nurse practitioners or physician assistants that have been trained to treat common illnesses and complaints. They're usually fairly quick and inexpensive. However, if you have serious medical issues or chronic medical problems, these are probably not your best option. ° °No Primary Care Doctor: °- Call [facility] at  832-8000 - they can help you locate a primary care doctor that  accepts your insurance, provides certain services, etc. °- Physician Referral Service- 1-800-533-3463 ° °Chronic Pain Problems: °Organization         Address  Phone   Notes  °[facility]  (336) 297-2271 Patients need to be referred by their primary care doctor.  ° °Medication  Assistance: °Organization         Address  Phone   Notes  °[place] [facility] [address] °[place], [place] [address] (336) 641-8030 --Must be a resident of [place] °-- Must have NO insurance coverage whatsoever (no Medicaid/ Medicare, etc.) °-- The pt. MUST have a primary care doctor that directs their care regularly and follows them in the community °  °[facility]  (866) 331-1348   °[employer]  (888) 892-1162   ° °Agencies that provide inexpensive medical care: °Organization         Address  Phone   Notes  °[facility]  (336) 832-8035   °[facility]    (336) 832-7272   °[facility] [address] °[place], [place] [address] (336) 832-4777   °[facility] [address], °[place] (336) 271-4999   °[facility]    (336) 373-0678   °[facility]    (336) 272-1050   °[facility] ° [address], [place] Phone:  (336) 832-4444, Fax:  (336) 832-4440 Hours of Operation:  9 am - 6 pm, M-F.  Also accepts Medicaid/Medicare and self-pay.  °[facility] ° [address], [place] Phone: (336) 832-3150, Fax: (336) [phone]. Hours of Operation:  8:30 am - 5:30 pm, M-F.  Also accepts Medicaid and self-pay.  °[facility] [place] [address], [place] Phone: (336) 878-6027   °[facility] [address], [place], [place] (336)723-1848, Ext. 123 Mondays & Thursdays: 7-9 AM.  First 15 patients are seen on a first come, first serve basis. °  ° °Medicaid-accepting [place] Providers: ° °Organization         Address  Phone   Notes  °[facility] [address], [place] (336) 641-2100 Also accepts self-pay patients.  °[facility] [address], [place] ° (336) 856-9996   °[facility] [address], [place]  (336) 288-8857   °[facility] [address] [place] [address], [place] (336) 299-7000   °Veita Bland [address], [place]  ° (336) 373-1557 Only accepts [place] Access Medicaid patients after they have their name applied to their card.  ° °Self-Pay (no insurance) in [place]: ° °Organization         Address  Phone   Notes  °Sickle Cell Patients, [facility] [address], [place] (336) 832-1970   °[facility] [address], [place] (336) 832-4400   °[facility] ° [address], [place] (336) 992-4800   °[facility]/Dr. Osei-Bonsu ° [address] [place] [address], [place] or [address], [place] (336) 841-8500 Phone number for both [place] and [place] locations is the same.  °[facility] [address], [place] (336) 299-0000   °[facility] [address] [place] [address], [place] or [address] (336) 852-7530 °(336) 878-2260   °[facility] [address], [place] (336) 350-1642, phone; (336) 294-5005, fax Sees patients 1st and 3rd Saturday of every month.  Must not qualify for public or private insurance (i.e. Medicaid, Medicare, [place] Health Choice, Veterans' Benefits) • Household income should be no more than 200% of the poverty level •The clinic cannot treat you if you are pregnant or think you are pregnant • Sexually transmitted diseases are not treated at the clinic.  ° ° °Dental Care: °Organization         Address  Phone  Notes  °[place] [facility] [address], [place] (336) 641-6152 Accepts children up to age 21 who are enrolled in Medicaid or [place] Health Choice; pregnant women with a Medicaid card; and children who have applied for Medicaid or [place] Health Choice, but were declined, whose parents can pay a reduced fee at time of service.  °[place]  [facility] [place]  [address], [place] (336) 641-7733 Accepts children up to age 21 who are enrolled in Medicaid or [place] Health Choice; pregnant women with a Medicaid card; and children who have applied for Medicaid or [place] Health Choice, but were declined, whose parents can pay a reduced fee at time of service.  °[facility] ° [address], [place] (336) 641-4533 Patients are seen by appointment only. Walk-ins are not accepted. [facility] will see patients 18 years of age and older. °Monday - Tuesday (8am-5pm) °Most Wednesdays (8:30-5pm) °$30 per visit, cash only  °[facility] ° [address], [place] (336) 641-4533 Patients are seen by appointment only. Walk-ins are not accepted. [facility] will see patients 18 years of age and older. °One   Wednesday Evening (Monthly: Volunteer Based).  $30 per visit, cash only  °[facility]  (919) 537-3737 for adults; Children under age 4, call Graduate Pediatric Dentistry at ([phone]) [phone]. Children aged 4-14, please call ([phone]) [phone] to request a pediatric application. ° Dental services are provided in all areas of dental care including fillings, crowns and bridges, complete and partial dentures, implants, gum treatment, root canals, and extractions. Preventive care is also provided. Treatment is provided to both adults and children. °Patients are selected via a lottery and there is often a waiting list. °  °[facility] [address], °[place] ° (336) 763-8833 [email or web address] °  °[facility] [address], [place], [place] (336)723-1848, Ext. 123 Second and Fourth Thursday of each month, opens at 6:30 AM; Clinic ends at 9 AM.  Patients are seen on a first-come first-served basis, and a limited number are seen during each clinic.  ° °[facility] ° [address], [place], [place] (336) 723-7904    Eligibility Requirements °You must have lived in [place], [place], or [place] counties for at least the last three months. °  You cannot be eligible for state or federal sponsored healthcare insurance, including Veterans Administration, Medicaid, or Medicare. °  You generally cannot be eligible for healthcare insurance through your employer.  °  How to apply: °Eligibility screenings are held every Tuesday and Wednesday afternoon from 1:00 pm until 4:00 pm. You do not need an appointment for the interview!  °[facility] [address], [place], [place] 336-631-2330   °[facility]  336-342-8273   °[facility]  336-703-3100   °[facility]  336-570-6415   ° °Behavioral Health Resources in the Community: °Intensive Outpatient Programs °Organization         Address  Phone  Notes  °[facility] [address], [place], [place] 336-878-6098   °[facility] [address], [place], [place] 336-832-9800   °[facility] [address], [place], [place] ° 336-882-2125   °[facility] [address],  °[place], [place] 1-800-853-5163 or 336-641-4981   °Substance Abuse Resources °Organization         Address  Phone  Notes  °[facility]  336-882-2125   °[facility]  336-784-9470   °[facility]  336-285-9073   °[facility]  336-845-3988   °Residential & Outpatient Substance Abuse Program  1-800-659-3381   °Psychological Services °Organization         Address  Phone  Notes  °[facility]  336- 832-9600   °[facility]  336- 378-7881   °[facility] [address], [place] 1-800-853-5163 or 336-641-4981   ° °Mobile Crisis Teams °Organization         Address  Phone  Notes  °[facility], Mobile Crisis Care Unit  1-877-626-1772   °Assertive °[facility] ° [address]  [place], [place] 336-834-9664   °Sharon DeEsch [address] °[place] 336-554-5454   ° °Self-Help/Support Groups °Organization         Address  Phone             Notes  °[facility]  - variety of support groups  336- 373-1402 Call for more information  °Narcotics Anonymous (NA), [facility] [address], °[place]  2 meetings at this location  ° °  Residential Treatment Programs Organization         Address  Phone  Notes  [facility] [address],    [place]  [phone]   [facility]  [address], [place] [phone], [place], [place] [phone]   [facility] [address], [place] [phone] Admissions: 8am-3pm M-F  [facility] [address]    [place], [place] [phone]   [facility] [address], [place], [place] [phone]) [phone]   [facility] [address]  [place], [place] [phone]   [facility] Intensive Outpatient [address], [place], [place] [phone]   [facility] ([facility] [address]  [place], [place] [phone])[phone] or [phone](320)[phone]   [facility] [address] [place], [place] [phone] Accepts Medicaid  [facility] [address]  [place] [phone] Substance Abuse/Addiction Treatment   [facility] Organization         Address  Phone  Notes  [facility]  845-687-9321(888) 847-457-4030   Angie FavaJulie Brannon, PhD [address], [address] [place], [place]   [phone](336) ([phone])[phone] or (901) [phone](336) [phone]   [facility]   [address] [place], [place] [phone](336) [phone]   [facility] [address], [place], [place] [phone](336) 801-816-8861 Insurance/Medicaid/sponsorship through [facility] [address]                                    [place], [place] ([phone]) [phone](336) [phone] Therapy/tele-psych/case    [facility] [address]   [place], [place] [phone](336) [phone]    Dr. Lolly MustacheArfeen  [phone]) [phone]   [facility]  [employer] [facility] 1) [address], [place] 2) [address], [place] 3)  [address], [place] [phone](336) [phone](336) [phone]  [phone](336) [phone]   [facility] Child Abuse Hotline (223) 683-6593(336) [phone] or [phone](336) [phone] (After Hours)     : Make an appointment with primary care physician community wellness is accepting new patients

## 2015-08-23 LAB — URINE CULTURE
Culture: >=100000
Special Requests: NORMAL

## 2015-08-24 ENCOUNTER — Telehealth (HOSPITAL_BASED_OUTPATIENT_CLINIC_OR_DEPARTMENT_OTHER): Payer: Self-pay | Admitting: Emergency Medicine

## 2015-08-24 NOTE — Telephone Encounter (Signed)
Post ED Visit - Positive Culture Follow-up  Culture report reviewed by antimicrobial stewardship pharmacist:  []  Celedonio MiyamotoJeremy Frens, Pharm.D., BCPS []  Georgina PillionElizabeth Martin, Pharm.D., BCPS []  [place]h Pham, [place]arm.D., BCPS, AAHIVP []  Estella HuskMichelle Turner, Pharm.D., BCPS, AAHIVP []  [place] Re[place], [address].D. []  Tennis Mustassie Stewart, Pharm.D. Lisette GrinderAlyson Leonard PharmD  Positive urine culture Proteus Treated with cephalexin, organism sensitive to the same and no further patient follow-up is required at this time.  Berle MullMiller, Aydrien Froman 08/24/2015, 9:21 AM

## 2015-09-05 ENCOUNTER — Emergency Department (HOSPITAL_COMMUNITY)
Admission: EM | Admit: 2015-09-05 | Discharge: 2015-09-05 | Disposition: A | Discharge status: Home / Self Care | Payer: Self-pay | Attending: Emergency Medicine | Admitting: Emergency Medicine

## 2015-09-05 ENCOUNTER — Encounter (HOSPITAL_COMMUNITY): Payer: Self-pay | Admitting: Emergency Medicine

## 2015-09-05 DIAGNOSIS — I10 Essential (primary) hypertension: Secondary | ICD-10-CM | POA: Insufficient documentation

## 2015-09-05 DIAGNOSIS — Z79899 Other long term (current) drug therapy: Secondary | ICD-10-CM | POA: Insufficient documentation

## 2015-09-05 DIAGNOSIS — Z792 Long term (current) use of antibiotics: Secondary | ICD-10-CM | POA: Insufficient documentation

## 2015-09-05 DIAGNOSIS — N390 Urinary tract infection, site not specified: Secondary | ICD-10-CM | POA: Insufficient documentation

## 2015-09-05 DIAGNOSIS — R110 Nausea: Secondary | ICD-10-CM | POA: Insufficient documentation

## 2015-09-05 DIAGNOSIS — R202 Paresthesia of skin: Secondary | ICD-10-CM | POA: Insufficient documentation

## 2015-09-05 HISTORY — DX: Essential (primary) hypertension: I10

## 2015-09-05 LAB — URINALYSIS, ROUTINE W REFLEX MICROSCOPIC
BILIRUBIN URINE: NEGATIVE
Glucose, UA: NEGATIVE mg/dL
KETONES UR: NEGATIVE mg/dL
Nitrite: NEGATIVE
PROTEIN: 30 mg/dL — AB
SPECIFIC GRAVITY, URINE: 1.015 (ref 1.005–1.030)
UROBILINOGEN UA: 0.2 mg/dL (ref 0.0–1.0)
pH: 6 (ref 5.0–8.0)

## 2015-09-05 LAB — URINE MICROSCOPIC-ADD ON

## 2015-09-05 MED ORDER — NITROFURANTOIN MONOHYD MACRO 100 MG PO CAPS: 100.0000 mg | ORAL_CAPSULE | Freq: Two times a day (BID) | ORAL | Status: DC

## 2015-09-05 MED ORDER — NITROFURANTOIN MONOHYD MACRO 100 MG PO CAPS
100.0000 mg | ORAL_CAPSULE | Freq: Once | ORAL | Status: AC
  Administered 2015-09-05: 100 mg via ORAL
  Filled 2015-09-05: qty 1

## 2015-09-05 NOTE — Discharge Instructions (Signed)
°Emergency Department Resource Guide °1) Find a Doctor and Pay Out of Pocket °Although you won't have to find out who is covered by your insurance plan, it is a good idea to ask around and get recommendations. You will then need to call the office and see if the doctor you have chosen will accept you as a new patient and what types of options they offer for patients who are self-pay. Some doctors offer discounts or will set up payment plans for their patients who do not have insurance, but you will need to ask so you aren't surprised when you get to your appointment. ° °2) Contact Your Local Health Department °Not all health departments have doctors that can see patients for sick visits, but many do, so it is worth a call to see if yours does. If you don't know where your local health department is, you can check in your phone book. The CDC also has a tool to help you locate your state's health department, and many state websites also have listings of all of their local health departments. ° °3) Find a Walk-in Clinic °If your illness is not likely to be very severe or complicated, you may want to try a walk in clinic. These are popping up all over the country in pharmacies, drugstores, and shopping centers. They're usually staffed by nurse practitioners or physician assistants that have been trained to treat common illnesses and complaints. They're usually fairly quick and inexpensive. However, if you have serious medical issues or chronic medical problems, these are probably not your best option. ° °No Primary Care Doctor: °- Call Health Connect at  832-8000 - they can help you locate a primary care doctor that  accepts your insurance, provides certain services, etc. °- Physician Referral Service- 1-800-533-3463 ° °Chronic Pain Problems: °Organization         Address  Phone   Notes  °[facility]  (336) 297-2271 Patients need to be referred by their primary care doctor.  ° °Medication  Assistance: °Organization         Address  Phone   Notes  °[place] [facility] [address] °[place], [place] [address] (336) 641-8030 --Must be a resident of [place] °-- Must have NO insurance coverage whatsoever (no Medicaid/ Medicare, etc.) °-- The pt. MUST have a primary care doctor that directs their care regularly and follows them in the community °  °[facility]  (866) 331-1348   °[employer]  (888) 892-1162   ° °Agencies that provide inexpensive medical care: °Organization         Address  Phone   Notes  °[facility]  (336) 832-8035   °[facility]    (336) 832-7272   °[facility] [address] °[place], [place] [address] (336) 832-4777   °[facility] [address], °[place] (336) 271-4999   °[facility]    (336) 373-0678   °[facility]    (336) 272-1050   °[facility] ° [address],  Phone:  (336) 832-4444, Fax:  (336) 832-4440 Hours of Operation:  9 am - 6 pm, M-F.  Also accepts Medicaid/Medicare and self-pay.  °[facility] ° [address],  Phone: (336) 832-3150, Fax: (336) 832-3151. Hours of Operation:  8:30 am - 5:30 pm, M-F.  Also accepts Medicaid and self-pay.  °[facility] [place] [address], [place] Phone: (336) 878-6027   °[facility] [address], [place], [place] (336)723-1848, Ext. 123 Mondays & Thursdays: 7-9 AM.  First 15 patients are seen on a first come, first serve basis. °  ° °Medicaid-accepting [place] Providers: ° °Organization         Address  Phone   Notes  °[facility] [address], [place] (336) 641-2100 Also accepts self-pay patients.  °[facility] [address], [place] ° (336) 856-9996   °[facility] [address], [place]  (336) 288-8857   °[facility] [address] [place] [address], [place] (336) 299-7000   °Veita Bland [address], [place]  ° (336) 373-1557 Only accepts [place] Access Medicaid patients after they have their name applied to their card.  ° °Self-Pay (no insurance) in [place]: ° °Organization         Address  Phone   Notes  °Sickle Cell Patients, [facility] [address], [place] (336) 832-1970   °[facility] [address], [place] (336) 832-4400   °[facility] ° [address], [place] (336) 992-4800   °[facility]/Dr. Osei-Bonsu ° [address] [place] [address], [place] or [address], [place] (336) 841-8500 Phone number for both [place] and [place] locations is the same.  °[facility] [address], [place] (336) 299-0000   °[facility] [address] [place] [address], [place] or [address] (336) 852-7530 °(336) 878-2260   °[facility] [address], [place] (336) 350-1642, phone; (336) 294-5005, fax Sees patients 1st and 3rd Saturday of every month.  Must not qualify for public or private insurance (i.e. Medicaid, Medicare, [place] Health Choice, Veterans' Benefits) • Household income should be no more than 200% of the poverty level •The clinic cannot treat you if you are pregnant or think you are pregnant • Sexually transmitted diseases are not treated at the clinic.  ° ° °Dental Care: °Organization         Address  Phone  Notes  °[place] [facility] [address], [place] (336) 641-6152 Accepts children up to age 21 who are enrolled in Medicaid or [place] Health Choice; pregnant women with a Medicaid card; and children who have applied for Medicaid or [place] Health Choice, but were declined, whose parents can pay a reduced fee at time of service.  °[place]  [facility] [place]  [address], [place] (336) 641-7733 Accepts children up to age 21 who are enrolled in Medicaid or [place] Health Choice; pregnant women with a Medicaid card; and children who have applied for Medicaid or  Health Choice, but were declined, whose parents can pay a reduced fee at time of service.  °[facility] ° [address], [place] (336) 641-4533 Patients are seen by appointment only. Walk-ins are not accepted. [facility] will see patients 18 years of age and older. °Monday - Tuesday (8am-5pm) °Most Wednesdays (8:30-5pm) °$30 per visit, cash only  °[facility] ° [address], [place] (336) 641-4533 Patients are seen by appointment only. Walk-ins are not accepted. [facility] will see patients 18 years of age and older. °One   Wednesday Evening (Monthly: Volunteer Based).  $30 per visit, cash only  °[facility]  (919) 537-3737 for adults; Children under age 4, call Graduate Pediatric Dentistry at (919) [phone]. Children aged 4-14, please call (919) 537-3737 to request a pediatric application. ° Dental services are provided in all areas of dental care including fillings, crowns and bridges, complete and partial dentures, implants, gum treatment, root canals, and extractions. Preventive care is also provided. Treatment is provided to both adults and children. °Patients are selected via a lottery and there is often a waiting list. °  °[facility] [address], °[place] ° (336) 763-8833 [email or web address].[email or web address] °  °[facility] [address], [place], [place] (336)723-1848, Ext. 123 Second and Fourth Thursday of each month, opens at 6:30 AM; Clinic ends at 9 AM.  Patients are seen on a first-come first-served basis, and a limited number are seen during each clinic.  ° °[facility] ° [address], [place], [place] (336) 723-7904    Eligibility Requirements °You must have lived in [place], [place], or [place] counties for at least the last three months. °  You cannot be eligible for state or federal sponsored healthcare insurance, including Veterans Administration, Medicaid, or Medicare. °  You generally cannot be eligible for healthcare insurance through your employer.  °  How to apply: °Eligibility screenings are held every Tuesday and Wednesday afternoon from 1:00 pm until 4:00 pm. You do not need an appointment for the interview!  °[facility] [address], [place], [place] 336-631-2330   °[facility] [facility]  336-342-8273   °[facility]  336-703-3100   °[facility]  336-570-6415   ° °Behavioral Health Resources in the Community: °Intensive Outpatient Programs °Organization         Address  Phone  Notes  °[facility] [address], [place], [place] 336-878-6098   °[facility] [address], [place], [place] 336-832-9800   °[facility] [address], [place], [place] ° 336-882-2125   °[facility] [address],  °[place], [place] 1-800-853-5163 or 336-641-4981   °Substance Abuse Resources °Organization         Address  Phone  Notes  °[facility]  336-882-2125   °[facility]  336-784-9470   °[facility]  336-285-9073   °[facility]  336-845-3988   °Residential & Outpatient Substance Abuse Program  1-800-659-3381   °Psychological Services °Organization         Address  Phone  Notes  °[facility]  336- 832-9600   °[facility]  336- 378-7881   °[facility] [address], [place] 1-800-853-5163 or 336-641-4981   ° °Mobile Crisis Teams °Organization         Address  Phone  Notes  °[facility], Mobile Crisis Care Unit  1-877-626-1772   °Assertive °[facility] ° [address]  [place], [place] 336-834-9664   °Sharon DeEsch [address] ° [place] 336-554-5454   ° °Self-Help/Support Groups °Organization         Address  Phone             Notes  °[facility]  - variety of support groups  336- 373-1402 Call for more information  °Narcotics Anonymous (NA), [facility] [address], °[place]  2 meetings at this location  ° °  Residential Treatment Programs °Organization         Address  Phone  Notes  °[facility] [address],    °[place]  1-866-801-8205   °[facility] ° [address], [place], [place] 704-293-8524   °[facility] [address], [place] 336-845-3988 Admissions: 8am-3pm M-F  °[facility] [address]    °[place], [place] 336-841-1104   °[facility] [address], [place], [place] 336-379-7146   °[facility] [address]  °[place], [place] 336-285-9073   °[facility] - Intensive Outpatient [address], , [place] 336-852-3033   °[facility] [address]  °[place], [place] 1-877-615-2722 or 336-784-9470   °[facility] [address] [place], [place] 336-227-7417 Accepts Medicaid  °[facility] [address]  ° [place] 1-800-659-3381 Substance Abuse/Addiction Treatment  ° °[place] Behavioral Health Resources °Organization         Address  Phone  Notes  °[facility]  (888) 581-9988   °Julie Brannon, PhD [address] [place], [place]   (336) 349-5553 or (336) 951-0000   °[facility]   [address] °[place], [place] (336) 349-4454   °[facility] [address], [place], [place] (336) 342-8316 Insurance/Medicaid/sponsorship through [facility]  °[facility] [address]                                    [place], [place] (336) 342-8316 Therapy/tele-psych/case    °[facility] [address]  ° [place], [place] (336) 349-2233    °Dr. Arfeen  (336) 349-4544   °[facility] [place]  [employer] [place] [facility] 1) [address], [place] °2) [address], [place] °3)  [address], [place] (336) 349-3220 °(336) 342-7768 ° °(336) 342-8140   °[place] Child Abuse [facility] (336) 342-1394 or (336) 342-3537 (After Hours)    ° ° °

## 2015-09-05 NOTE — ED Notes (Signed)
Pt was treated here for UTI 2-3 weeks ago. For the past 2-3 days has had pelvic pressure with urination. Also says, "my urine smells funky and it's cloudy." Finished entire regimen of antibiotics and was also taking Pyridium. Denies dysuria or pain at this time. Denies N/V/D. Also c/o right arm numbness without numbness anywhere else. Hypertensive in triage-does not take HTN medications. Neurologically intact. A&Ox4. Ambulatory with steady gait.

## 2015-09-05 NOTE — ED Provider Notes (Signed)
CSN: [id number]     Arrival date & time 09/05/15  1910 History   First MD Initiated Contact with Patient 09/05/15 1959     Chief Complaint  Patient presents with  . Urinary Tract Infection  . Numbness     (Consider location/radiation/quality/duration/timing/severity/associated sxs/prior Treatment) Patient is a 44 y.o. female presenting with dysuria.  Dysuria Pain quality:  Aching Pain severity:  Mild Onset quality:  Gradual Duration:  2 days Timing:  Constant Progression:  Worsening Chronicity:  Recurrent Recent urinary tract infections: yes   Associated symptoms: fever and nausea     Past Medical History  Diagnosis Date  . Blood transfusion without reported diagnosis 1992    after D&C for SAB  . Hypertension    Past Surgical History  Procedure Laterality Date  . Novasure ablation  2014  . Dilation and curettage of uterus  1992   History reviewed. No pertinent family history. Social History  Substance Use Topics  . Smoking status: Never Smoker   . Smokeless tobacco: None  . Alcohol Use: Yes   OB History    Gravida Para Term Preterm AB TAB SAB Ectopic Multiple Living   4 3 3  1  1   3      Review of Systems  Constitutional: Positive for fever. Negative for chills.  Eyes: Negative for pain.  Respiratory: Negative for cough, shortness of breath and stridor.   Gastrointestinal: Positive for nausea.  Endocrine: Negative for polydipsia and polyuria.  Genitourinary: Positive for dysuria, urgency and decreased urine volume. Negative for hematuria and menstrual problem.  Musculoskeletal: Negative for back pain and neck pain.  Skin: Negative for pallor.  Neurological: Negative for dizziness.       Right arm paresthesias a few days ago, gradually improving, now just an ache  All other systems reviewed and are negative.     Allergies  Review of patient's allergies indicates no known allergies.  Home Medications   Prior to Admission medications   Medication Sig  Start Date End Date Taking? Authorizing Provider  amoxicillin-clavulanate (AUGMENTIN) 500-125 MG per tablet Take 1 tablet (500 mg total) by mouth every 8 (eight) hours. 04/21/15   Tiffany Neva SeatGreene, PA-C  cephALEXin (KEFLEX) 500 MG capsule Take 1 capsule (500 mg total) by mouth 2 (two) times daily. 08/20/15   Earley FavorGail Schulz, NP  Chlorpheniramine-Phenylephrine (SINUS & ALLERGY PO) Take 2 tablets by mouth daily as needed (allergies).    Historical Provider, MD  nitrofurantoin, macrocrystal-monohydrate, (MACROBID) 100 MG capsule Take 1 capsule (100 mg total) by mouth 2 (two) times daily. 09/05/15   Marily MemosJason Falcon Mccaskey, MD  phenazopyridine (PYRIDIUM) 100 MG tablet Take 1 tablet (100 mg total) by mouth 3 (three) times daily with meals. 08/21/15   Earley FavorGail Schulz, NP  traMADol (ULTRAM) 50 MG tablet Take 1 tablet (50 mg total) by mouth every 6 (six) hours as needed. 04/21/15   Tiffany Neva SeatGreene, PA-C   BP 155/98 mmHg  Pulse 72  Temp(Src) 98.3 F (36.8 C) (Oral)  Resp 18  SpO2 100% Physical Exam  Constitutional: She appears well-developed and well-nourished.  HENT:  Head: Normocephalic and atraumatic.  Neck: Normal range of motion.  Cardiovascular: Normal rate and regular rhythm.   Pulmonary/Chest: No stridor. No respiratory distress.  Abdominal: She exhibits no distension.  Musculoskeletal: Normal range of motion. She exhibits no tenderness.  Right arm with minimal swelling over dorsum of hand  Neurological: She is alert.  Normal sensation, strength and ROM of all fingers on right hand  Nursing note and vitals reviewed.   ED Course  Procedures (including critical care time) Labs Review Labs Reviewed  URINALYSIS, ROUTINE W REFLEX MICROSCOPIC (NOT AT [facility]) - Abnormal; Notable for the following:    APPearance TURBID (*)    Hgb urine dipstick LARGE (*)    Protein, ur 30 (*)    Leukocytes, UA LARGE (*)    All other components within normal limits  URINE MICROSCOPIC-ADD ON - Abnormal; Notable for the following:     Bacteria, UA MANY (*)    All other components within normal limits  URINE CULTURE    Imaging Review No results found. I have personally reviewed and evaluated these images and lab results as part of my medical decision-making.   EKG Interpretation None      MDM   Final diagnoses:  UTI (lower urinary tract infection)   Likely UTI, will tx even if UA equivocal. Unsure of cause of symptoms in right arm. Doubt dvt without erythema, pain or arm swelling. Already resolving, patient will continue to monitor.   UA w/ UTI, culture sent. Started nitrofurantoin as she felt the keflex caused problems last time.   I have personally and contemperaneously reviewed labs and imaging and used in my decision making as above.   A medical screening exam was performed and I feel the patient has had an appropriate workup for their chief complaint at this time and likelihood of emergent condition existing is low. They have been counseled on decision, discharge, follow up and which symptoms necessitate immediate return to the emergency department. They or their family verbally stated understanding and agreement with plan and discharged in stable condition.      Marily Memos, MD 09/06/15 [phone]

## 2015-09-08 LAB — URINE CULTURE

## 2015-09-09 NOTE — Progress Notes (Signed)
ED Antimicrobial Stewardship Positive Culture Follow Up   Debra Vargas is an 44 y.o. female who [facility] to [facility] with a chief complaint of  Chief Complaint  Patient presents with  . Urinary Tract Infection  . Numbness    Recent Results (from the past 720 hour(s))  Urine culture     Status: None   Collection Time: 08/20/15  7:05 PM  Result Value Ref Range Status   Specimen Description URINE, RANDOM  Final   Special Requests none Normal  Final   Culture   Final    >=100,000 COLONIES/mL PROTEUS MIRABILIS Performed at [facility]    Report Status 08/23/2015 FINAL  Final   Organism ID, Bacteria PROTEUS MIRABILIS  Final      Susceptibility   Proteus mirabilis - MIC*    AMPICILLIN <=2 SENSITIVE Sensitive     CEFAZOLIN <=4 SENSITIVE Sensitive     CEFTRIAXONE <=1 SENSITIVE Sensitive     CIPROFLOXACIN <=0.25 SENSITIVE Sensitive     GENTAMICIN <=1 SENSITIVE Sensitive     IMIPENEM 1 SENSITIVE Sensitive     NITROFURANTOIN 64 RESISTANT Resistant     TRIMETH/SULFA <=20 SENSITIVE Sensitive     AMPICILLIN/SULBACTAM <=2 SENSITIVE Sensitive     PIP/TAZO <=4 SENSITIVE Sensitive     * >=100,000 COLONIES/mL PROTEUS MIRABILIS  Urine culture     Status: None   Collection Time: 09/05/15  7:58 PM  Result Value Ref Range Status   Specimen Description URINE, RANDOM  Final   Special Requests NONE  Final   Culture   Final    >=100,000 COLONIES/mL PROTEUS MIRABILIS [facility]    Report Status 09/08/2015 FINAL  Final   Organism ID, Bacteria PROTEUS MIRABILIS  Final      Susceptibility   Proteus mirabilis - MIC*    AMPICILLIN <=2 SENSITIVE Sensitive     CEFAZOLIN <=4 SENSITIVE Sensitive     CEFTRIAXONE <=1 SENSITIVE Sensitive     CIPROFLOXACIN <=0.25 SENSITIVE Sensitive     GENTAMICIN <=1 SENSITIVE Sensitive     IMIPENEM 0.5 SENSITIVE Sensitive     NITROFURANTOIN 64 RESISTANT Resistant     TRIMETH/SULFA <=20 SENSITIVE Sensitive    AMPICILLIN/SULBACTAM <=2 SENSITIVE Sensitive     PIP/TAZO <=4 SENSITIVE Sensitive     * >=100,000 COLONIES/mL PROTEUS MIRABILIS    [x]  Treated with nitrofurantoin, organism resistant to prescribed antimicrobial  New antibiotic prescription: Bactrim 800/160mg  PO BID x3 days.  ED Provider: Danelle BerryLeisa Tapia, PA-C   Debra Vargas 09/09/2015, 2:11 PM PGY1 Resident Phone# [phone]

## 2015-09-11 ENCOUNTER — Telehealth (HOSPITAL_COMMUNITY): Payer: Self-pay

## 2015-09-11 NOTE — Telephone Encounter (Signed)
Post ED Visit - Positive Culture Follow-up: Successful Patient Follow-Up  Culture assessed and recommendations reviewed by: []  Enzo BiNathan Batchelder, Pharm.D. []  Celedonio MiyamotoJeremy Frens, Pharm.D., BCPS []  Garvin FilaMike Maccia, Pharm.D. []  Georgina PillionElizabeth Martin, Pharm.D., BCPS []  [place]burgMinh Pham, VermontPharm.D., BCPS, AAHIVP []  Estella HuskMichelle Turner, Pharm.D., BCPS, AAHIVP []  Tennis Mustassie Stewart, Pharm.D. [x]  Sherle Poeob Vincent, [address]  Positive urine culture, >/=100,000  colonies -> E Coli  []  Patient discharged without antimicrobial prescription and treatment is now indicated [x]  Organism is resistant to prescribed ED discharge antimicrobial, Nitrofurantoin []  Patient with positive blood cultures  Changes discussed with ED provider: Henry RusselLesia Tapia PA-C New antibiotic prescription "Bactrim 800/160 BID x 3 days" Called to N/A  Contacted patient, date 09/11/15, time 11:28 EPIC # can not accept calls.  [facility]   Arvid RightClark, Ranen Doolin Dorn 09/11/2015, 11:29 AM

## 2015-09-30 ENCOUNTER — Telehealth (HOSPITAL_COMMUNITY): Payer: Self-pay

## 2015-09-30 NOTE — Telephone Encounter (Signed)
Unable to reach by phone or mail.  Chart closed.   

## 2016-03-14 ENCOUNTER — Other Ambulatory Visit (HOSPITAL_COMMUNITY): Payer: Self-pay | Admitting: *Deleted

## 2016-03-14 DIAGNOSIS — N6320 Unspecified lump in the left breast, unspecified quadrant: Secondary | ICD-10-CM

## 2016-03-31 ENCOUNTER — Ambulatory Visit
Admission: RE | Admit: 2016-03-31 | Discharge: 2016-03-31 | Disposition: A | Discharge status: Home / Self Care | Payer: No Typology Code available for payment source | Source: Ambulatory Visit | Attending: Obstetrics and Gynecology | Admitting: Obstetrics and Gynecology

## 2016-03-31 ENCOUNTER — Other Ambulatory Visit (HOSPITAL_COMMUNITY): Payer: Self-pay | Admitting: Obstetrics and Gynecology

## 2016-03-31 ENCOUNTER — Ambulatory Visit (HOSPITAL_COMMUNITY)
Admission: RE | Admit: 2016-03-31 | Discharge: 2016-03-31 | Disposition: A | Discharge status: Home / Self Care | Payer: Self-pay | Source: Ambulatory Visit | Attending: Obstetrics and Gynecology | Admitting: Obstetrics and Gynecology

## 2016-03-31 ENCOUNTER — Encounter (HOSPITAL_COMMUNITY): Payer: Self-pay

## 2016-03-31 VITALS — BP 116/70 | Ht 61.0 in | Wt 205.4 lb

## 2016-03-31 DIAGNOSIS — Z1239 Encounter for other screening for malignant neoplasm of breast: Secondary | ICD-10-CM

## 2016-03-31 DIAGNOSIS — R87612 Low grade squamous intraepithelial lesion on cytologic smear of cervix (LGSIL): Secondary | ICD-10-CM

## 2016-03-31 DIAGNOSIS — N6320 Unspecified lump in the left breast, unspecified quadrant: Secondary | ICD-10-CM

## 2016-03-31 DIAGNOSIS — N6310 Unspecified lump in the right breast, unspecified quadrant: Secondary | ICD-10-CM

## 2016-03-31 NOTE — Progress Notes (Signed)
Complaints of left breast lump x 3 months that has changed in size. Patient stated the lump is tender at times.   Pap Smear:  Pap smear not completed today. [facility] and normal per patient. Per patient has no history of an abnormal Pap smear. No Pap smear results in EPIC.  Physical exam: Breasts Breasts symmetrical. No skin abnormalities bilateral breasts. No nipple retraction bilateral breasts. No nipple discharge bilateral breasts. No lymphadenopathy. Palpated two lumps within the left breast at 1 o'clock 4 cm from the nipple and 12 o'clock right above the areola. Palpated two lumps within the right breast at 9 o'clock 4 cm from the nipple and 12 o'clock 5 cm from the nipple. No complaints of pain or tenderness on exam. [facility]. Appointment scheduled for Thursday, Mar 31, 2016 at 1130.  Pelvic/Bimanual No Pap smear completed today since last Pap smear was in May 2015 per patient. Pap smear not indicated per BCCCP guidelines.   Smoking History: Patient has never smoked.  Patient Navigation: Patient education provided. Access to services provided for [facility] through [facility].

## 2016-03-31 NOTE — Patient Instructions (Signed)
Educational materials on self breast awareness given. Explained to Debra Vargas that she did not need a Pap smear today due to last Pap smear was in May 2015 per patient. Let her know BCCCP will cover Pap smears every 3 years unless has a history of abnormal Pap smears. Reminded patient that her next Pap smear will be due in one year. [facility]. Appointment scheduled for Thursday, Mar 31, 2016 at 1130. Debra Vargas verbalized understanding.  Tekesha Almgren, Kathaleen Maserhristine Poll, RN 12:40 PM

## 2016-04-01 ENCOUNTER — Other Ambulatory Visit: Payer: Self-pay

## 2016-04-08 ENCOUNTER — Encounter (HOSPITAL_COMMUNITY): Payer: Self-pay | Admitting: *Deleted

## 2016-05-30 ENCOUNTER — Emergency Department (HOSPITAL_COMMUNITY)
Admission: EM | Admit: 2016-05-30 | Discharge: 2016-05-31 | Disposition: A | Discharge status: Home / Self Care | Payer: No Typology Code available for payment source | Attending: Emergency Medicine | Admitting: Emergency Medicine

## 2016-05-30 ENCOUNTER — Encounter (HOSPITAL_COMMUNITY): Payer: Self-pay | Admitting: *Deleted

## 2016-05-30 DIAGNOSIS — I10 Essential (primary) hypertension: Secondary | ICD-10-CM | POA: Insufficient documentation

## 2016-05-30 DIAGNOSIS — J0191 Acute recurrent sinusitis, unspecified: Secondary | ICD-10-CM | POA: Insufficient documentation

## 2016-05-30 NOTE — ED Triage Notes (Signed)
Pt c/o sinus pressure and pain. Taking OTC without relief

## 2016-05-31 MED ORDER — PSEUDOEPHEDRINE HCL 60 MG PO TABS: 60.0000 mg | ORAL_TABLET | Freq: Four times a day (QID) | ORAL | 0 refills | Status: DC | PRN

## 2016-05-31 MED ORDER — FLUTICASONE PROPIONATE 50 MCG/ACT NA SUSP: 2.0000 | Freq: Every day | NASAL | 1 refills | Status: DC

## 2016-05-31 NOTE — Discharge Instructions (Signed)
Medications: Flonase, pseudophedrine  Treatment: Take Flonase once daily in both nostrils. Take Sudafed every 6 hours as needed for nasal congestion and sinus pressure. Use over-the-counter nasal saline spray or Nettie pot to rinse your sinuses as indicated in the attachments below.  Follow-up: Please follow-up with the ENT doctor, Dr. Lazarus Salines, for further evaluation and treatment of your recurrent sinus pain. Please return to emergency department if you develop any new or worsening symptoms or if your symptoms are not improving in 7-10 days. At this time, antibiotic treatment may be indicated.

## 2016-05-31 NOTE — ED Provider Notes (Signed)
[facility] Provider Note   CSN: [id number] Arrival date & time: 05/30/16  0865  First Provider Contact:  First MD Initiated Contact with Patient 05/31/16 0025        History   Chief Complaint Chief Complaint  Patient presents with  . Sinusitis    HPI Debra Vargas is a 45 y.o. female who presents with a 1 day history of facial pressure, rhinorrhea, ear pressure. Patient states that she gets recurrent sinusitis and facial pressure which usually resolves within 1 day. Patient presents because she has been trying antihistamine medications which are making her very drowsy. Patient is not comfortable driving to and from work with these medications. Patient states that she last experienced facial pressure about 2 weeks ago and her symptoms resolved by themselves in about one day. Patient states that she has tried nasal sprays and pseudoephedrine in the past with some relief. Patient denies fevers, cough, sore throat, chest pain, shortness of breath, abdominal pain, nausea, vomiting, dysuria.  HPI  Past Medical History:  Diagnosis Date  . Blood transfusion without reported diagnosis 1992   after D&C for SAB  . Hypertension     Patient Active Problem List   Diagnosis Date Noted  . Chronic sinusitis 06/17/2013  . Unspecified essential hypertension 06/17/2013    Past Surgical History:  Procedure Laterality Date  . DILATION AND CURETTAGE OF UTERUS  1992  . NOVASURE ABLATION  2014    OB History    Gravida Para Term Preterm AB Living   SAB TAB Ectopic Multiple Live Births   1       3       Home Medications    Prior to Admission medications   Medication Sig Start Date End Date Taking? Authorizing Provider  amoxicillin-clavulanate (AUGMENTIN) 500-125 MG per tablet Take 1 tablet (500 mg total) by mouth every 8 (eight) hours. Patient not taking: Reported on 03/31/2016 04/21/15   Marlon Pel, PA-C  cephALEXin (KEFLEX) 500 MG capsule Take 1 capsule (500 mg  total) by mouth 2 (two) times daily. Patient not taking: Reported on 03/31/2016 08/20/15   Earley Favor, NP  Chlorpheniramine-Phenylephrine (SINUS & ALLERGY PO) Take 2 tablets by mouth daily as needed (allergies). Reported on 03/31/2016    Historical Provider, MD  fluticasone (FLONASE) 50 MCG/ACT nasal spray Place 2 sprays into both nostrils daily. 05/31/16   Emi Holes, PA-C  nitrofurantoin, macrocrystal-monohydrate, (MACROBID) 100 MG capsule Take 1 capsule (100 mg total) by mouth 2 (two) times daily. Patient not taking: Reported on 03/31/2016 09/05/15   Marily Memos, MD  phenazopyridine (PYRIDIUM) 100 MG tablet Take 1 tablet (100 mg total) by mouth 3 (three) times daily with meals. Patient not taking: Reported on 03/31/2016 08/21/15   Earley Favor, NP  pseudoephedrine (SUDAFED) 60 MG tablet Take 1 tablet (60 mg total) by mouth every 6 (six) hours as needed for congestion. 05/31/16   Emi Holes, PA-C  traMADol (ULTRAM) 50 MG tablet Take 1 tablet (50 mg total) by mouth every 6 (six) hours as needed. Patient not taking: Reported on 03/31/2016 04/21/15   Marlon Pel, PA-C    Family History Family History  Problem Relation Age of Onset  . Diabetes Mother   . Hypertension Mother   . Thyroid disease Mother   . Hypertension Father   . Diabetes Maternal Aunt   . Stroke Maternal Grandmother     Social History Social History  Substance Use  Topics  . Smoking status: Never Smoker  . Smokeless tobacco: Never Used  . Alcohol use Yes     Comment: socially     Allergies   Review of patient's allergies indicates no known allergies.   Review of Systems Review of Systems  Constitutional: Negative for chills and fever.  HENT: Positive for congestion, ear pain, rhinorrhea and sinus pressure. Negative for facial swelling and sore throat.   Respiratory: Negative for shortness of breath.   Cardiovascular: Negative for chest pain.  Gastrointestinal: Negative for abdominal pain, nausea and  vomiting.  Genitourinary: Negative for dysuria.  Musculoskeletal: Negative for back pain.  Skin: Negative for rash and wound.  Neurological: Negative for headaches.  Psychiatric/Behavioral: The patient is not nervous/anxious.      Physical Exam Updated Vital Signs BP (!) 174/102 (BP Location: Right Arm)   Pulse 79   Temp 98.2 F (36.8 C) (Oral)   Resp 18   SpO2 98%   Physical Exam  Constitutional: She appears well-developed and well-nourished. No distress.  HENT:  Head: Normocephalic and atraumatic.  Right Ear: Tympanic membrane normal.  Left Ear: Tympanic membrane normal.  Mouth/Throat: Oropharynx is clear and moist. No oropharyngeal exudate.  No facial/sinus tenderness on palpation  Eyes: Conjunctivae are normal. Pupils are equal, round, and reactive to light. Right eye exhibits no discharge. Left eye exhibits no discharge. No scleral icterus.  Neck: Normal range of motion. Neck supple. No thyromegaly present.  Cardiovascular: Normal rate, regular rhythm, normal heart sounds and intact distal pulses.  Exam reveals no gallop and no friction rub.   No murmur heard. Pulmonary/Chest: Effort normal and breath sounds normal. No stridor. No respiratory distress. She has no wheezes. She has no rales.  Abdominal: Soft. Bowel sounds are normal. She exhibits no distension. There is no tenderness. There is no rebound and no guarding.  Musculoskeletal: She exhibits no edema.  Lymphadenopathy:    She has no cervical adenopathy.  Neurological: She is alert. Coordination normal.  Skin: Skin is warm and dry. No rash noted. She is not diaphoretic. No pallor.  Psychiatric: She has a normal mood and affect.  Nursing note and vitals reviewed.    ED Treatments / Results  Labs (all labs ordered are listed, but only abnormal results are displayed) Labs Reviewed - No data to display  EKG  EKG Interpretation None       Radiology No results found.  Procedures Procedures (including  critical care time)  Medications Ordered in ED Medications - No data to display   Initial Impression / Assessment and Plan / ED Course  I have reviewed the triage vital signs and the nursing notes.  Pertinent labs & imaging results that were available during my care of the patient were reviewed by me and considered in my medical decision making (see chart for details).  Clinical Course     Final Clinical Impressions(s) / ED Diagnoses   Final diagnoses:  Acute recurrent sinusitis, unspecified location    Patient complaining of symptoms of sinusitis such as nasal congestion and facial pain/pressure.  Mild to moderate symptoms of clear/yellow nasal discharge/congestion and scratchy throat with cough for 1 day.  Patient is afebrile.  No concern for acute bacterial rhinosinusitis; likely viral in nature.  Patient discharged with symptomatic treatment including Flonase nasal spray and pseudoephedrine.  Patient instructions given for warm saline nasal washes and Netipot.  Recommendations for follow-up with primary care physician and further evaluation by ENT for recurrent sinusitis. Patient understands and  agrees with plan and discharged in satisfactory condition.   New Prescriptions Discharge Medication List as of 05/31/2016 12:50 AM    START taking these medications   Details  fluticasone (FLONASE) 50 MCG/ACT nasal spray Place 2 sprays into both nostrils daily., Starting Tue 05/31/2016, Print    pseudoephedrine (SUDAFED) 60 MG tablet Take 1 tablet (60 mg total) by mouth every 6 (six) hours as needed for congestion., Starting Tue 05/31/2016, Print         Emi Holes, PA-C 05/31/16 1705    Dione Booze, MD 05/31/16 2112

## 2016-07-16 ENCOUNTER — Emergency Department (HOSPITAL_COMMUNITY)
Admission: EM | Admit: 2016-07-16 | Discharge: 2016-07-16 | Disposition: A | Discharge status: Home / Self Care | Payer: No Typology Code available for payment source | Attending: Emergency Medicine | Admitting: Emergency Medicine

## 2016-07-16 ENCOUNTER — Emergency Department (HOSPITAL_BASED_OUTPATIENT_CLINIC_OR_DEPARTMENT_OTHER): Admit: 2016-07-16 | Discharge: 2016-07-16 | Disposition: A | Discharge status: Home / Self Care | Payer: Self-pay

## 2016-07-16 ENCOUNTER — Encounter (HOSPITAL_COMMUNITY): Payer: Self-pay

## 2016-07-16 DIAGNOSIS — Z79899 Other long term (current) drug therapy: Secondary | ICD-10-CM | POA: Insufficient documentation

## 2016-07-16 DIAGNOSIS — M79609 Pain in unspecified limb: Secondary | ICD-10-CM

## 2016-07-16 DIAGNOSIS — I10 Essential (primary) hypertension: Secondary | ICD-10-CM | POA: Insufficient documentation

## 2016-07-16 DIAGNOSIS — M79662 Pain in left lower leg: Secondary | ICD-10-CM | POA: Insufficient documentation

## 2016-07-16 DIAGNOSIS — M79605 Pain in left leg: Secondary | ICD-10-CM

## 2016-07-16 MED ORDER — NAPROXEN 500 MG PO TABS: 500.0000 mg | ORAL_TABLET | Freq: Two times a day (BID) | ORAL | 0 refills | Status: DC

## 2016-07-16 MED ORDER — METHOCARBAMOL 500 MG PO TABS: 500.0000 mg | ORAL_TABLET | Freq: Two times a day (BID) | ORAL | 0 refills | Status: DC

## 2016-07-16 MED ORDER — METHOCARBAMOL 500 MG PO TABS
500.0000 mg | ORAL_TABLET | Freq: Once | ORAL | Status: AC
  Administered 2016-07-16: 500 mg via ORAL
  Filled 2016-07-16: qty 1

## 2016-07-16 MED ORDER — TRAMADOL HCL 50 MG PO TABS: 50.0000 mg | ORAL_TABLET | Freq: Four times a day (QID) | ORAL | 0 refills | Status: DC | PRN

## 2016-07-16 MED ORDER — TRAMADOL HCL 50 MG PO TABS
50.0000 mg | ORAL_TABLET | Freq: Once | ORAL | Status: AC
  Administered 2016-07-16: 50 mg via ORAL
  Filled 2016-07-16: qty 1

## 2016-07-16 NOTE — ED Provider Notes (Signed)
[facility]-EMER[facility]ENCY DEPT Provider Note   CSN: [id number] Arrival date & time: 07/16/16  1749  By signing my name below, I, Debra Vargas, attest that this documentation has been prepared under the direction and in the presence of Santiago GladHeather Wynee Matarazzo, PA-C. Electronically signed by: Debra Vargas, ED Scribe. 07/16/16. 6:33 PM.  History   Chief Complaint Chief Complaint  Patient presents with  . Leg Pain   The history is provided by the patient. No language interpreter was used.   HPI Comments:  Debra Vargas is a 45 y.o. female who presents to the Emergency Department complaining of left upper leg pain, which started 6 hours ago. Pt reports that she started experiencing sudden, aching pain in posterior left upper leg.  Associated symptoms include difficulty ambulating secondary to pain. She did not take any medications prior to arrival. Denies radiation of pain, redness, swelling, numbness, tingling, chest pain, SOB, birth control use, recent travels or previous history of blood clots. Pt is not aware of any recent injuries.  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  Past Medical History:  Diagnosis Date  . Blood transfusion without reported diagnosis 1992   after D&C for SAB  . Hypertension     Patient Active Problem List   Diagnosis Date Noted  . Chronic sinusitis 06/17/2013  . Unspecified essential hypertension 06/17/2013    Past Surgical History:  Procedure Laterality Date  . DILATION AND CURETTAGE OF UTERUS  1992  . NOVASURE ABLATION  2014    OB History    Gravida Para Term Preterm AB Living   4 3 3   1 3    SAB TAB Ectopic Multiple Live Births   1       3       Home Medications    Prior to Admission medications   Medication Sig Start Date End Date Taking? Authorizing Provider  amoxicillin-clavulanate (AUGMENTIN) 500-125 MG per tablet Take 1 tablet (500 mg total) by mouth every 8 (eight) hours. Patient not taking: Reported on 03/31/2016  04/21/15   Marlon Peliffany Greene, PA-C  cephALEXin (KEFLEX) 500 MG capsule Take 1 capsule (500 mg total) by mouth 2 (two) times daily. Patient not taking: Reported on 03/31/2016 08/20/15   Earley FavorGail Schulz, NP  Chlorpheniramine-Phenylephrine (SINUS & ALLERGY PO) Take 2 tablets by mouth daily as needed (allergies). Reported on 03/31/2016    Historical Provider, MD  fluticasone (FLONASE) 50 MCG/ACT nasal spray Place 2 sprays into both nostrils daily. 05/31/16   Emi HolesAlexandra M Law, PA-C  nitrofurantoin, macrocrystal-monohydrate, (MACROBID) 100 MG capsule Take 1 capsule (100 mg total) by mouth 2 (two) times daily. Patient not taking: Reported on 03/31/2016 09/05/15   Marily MemosJason Mesner, MD  phenazopyridine (PYRIDIUM) 100 MG tablet Take 1 tablet (100 mg total) by mouth 3 (three) times daily with meals. Patient not taking: Reported on 03/31/2016 08/21/15   Earley FavorGail Schulz, NP  pseudoephedrine (SUDAFED) 60 MG tablet Take 1 tablet (60 mg total) by mouth every 6 (six) hours as needed for congestion. 05/31/16   Emi HolesAlexandra M Law, PA-C  traMADol (ULTRAM) 50 MG tablet Take 1 tablet (50 mg total) by mouth every 6 (six) hours as needed. Patient not taking: Reported on 03/31/2016 04/21/15   Marlon Peliffany Greene, PA-C    Family History Family History  Problem Relation Age of Onset  . Diabetes Mother   . Hypertension Mother   . Thyroid disease Mother   . Hypertension Father   . Diabetes Maternal  Aunt   . Stroke Maternal Grandmother     Social History Social History  Substance Use Topics  . Smoking status: Never Smoker  . Smokeless tobacco: Never Used  . Alcohol use Yes     Comment: socially     Allergies   Review of patient's allergies indicates no known allergies.   Review of Systems Review of Systems 10 systems reviewed and all are negative for acute change except as noted in the HPI.   Physical Exam Updated Vital Signs BP 167/95 (BP Location: Right Arm)   Pulse 81   Temp 98.6 F (37 C) (Oral)   Resp 17   Ht 5\' 1"  (1.549  m)   Wt 191 lb (86.6 kg)   SpO2 100%   BMI 36.09 kg/m   Physical Exam  Constitutional: She is oriented to person, place, and time. She appears well-developed and well-nourished.  HENT:  Head: Normocephalic and atraumatic.  Neck: Normal range of motion.  Cardiovascular: Normal rate, regular rhythm and normal heart sounds.   Pulses:      Dorsalis pedis pulses are 2+ on the right side, and 2+ on the left side.  Pulmonary/Chest: Effort normal and breath sounds normal.  Musculoskeletal: Normal range of motion.  No erythema, edema or warmth of left leg. Some TTP over the left hamstring muscle, compartments soft Increased pain of the hamstring with straight leg raise No TTP or swelling of left calf. No TTP of lumbar spine.   Neurological: She is alert and oriented to person, place, and time. She has normal strength. Gait normal.  Distal sensations of left foot intact.  Skin: Skin is warm and dry.  Psychiatric: She has a normal mood and affect.  Nursing note and vitals reviewed.    ED Treatments / Results  DIAGNOSTIC STUDIES:  Oxygen Saturation is 100% on room air, normal by my interpretation.    COORDINATION OF CARE:  6:29 PM Discussed treatment plan with pt at bedside, which includes ultrasound of left leg and anti-inflammatory medication, and pt agreed to plan.  Labs (all labs ordered are listed, but only abnormal results are displayed) Labs Reviewed - No data to display  EKG  EKG Interpretation None       Radiology No results found.  Procedures Procedures (including critical care time)  Medications Ordered in ED Medications - No data to display   Initial Impression / Assessment and Plan / ED Course  I have reviewed the triage vital signs and the nursing notes.  Pertinent labs & imaging results that were available during my care of the patient were reviewed by me and considered in my medical decision making (see chart for details).  Clinical Course      Final Clinical Impressions(s) / ED Diagnoses   Final diagnoses:  None   Patient presents today with pain of the left hamstring muscle.  She denies any acute injury or trauma to the area.  No signs of infection on exam.  Doppler ultrasound negative for DVT.  She is neurovascularly intact.  No lower back pain or hip pain, therefore, doubt sciatica.  Suspect muscle strain.  Stable for discharge.  Return precautions given.   New Prescriptions New Prescriptions   No medications on file   I personally performed the services described in this documentation, which was scribed in my presence. The recorded information has been reviewed and is accurate.     Santiago Glad, PA-C 07/16/16 2151    Mancel Bale, MD 07/16/16 [phone]

## 2016-07-16 NOTE — ED Triage Notes (Signed)
Pt states that around noon she began to experience sudden aching pain in her L posterior upper leg under her buttock. Pt denies swelling, SOB, or recent prolonged periods of sitting. Pt is ambulatory at this time. A&Ox4.

## 2016-07-16 NOTE — Progress Notes (Signed)
VASCULAR LAB PRELIMINARY  PRELIMINARY  PRELIMINARY  PRELIMINARY  Left lower extremity venous duplex completed.    Preliminary report:  There is no DVT or SVT noted in the left lower extremity.  Luanne Krzyzanowski, RVT 07/16/2016, 7:45 PM

## 2016-07-16 NOTE — ED Notes (Signed)
US tech at bedside

## 2016-07-19 ENCOUNTER — Ambulatory Visit (HOSPITAL_COMMUNITY)
Admission: EM | Admit: 2016-07-19 | Discharge: 2016-07-19 | Disposition: A | Discharge status: Home / Self Care | Payer: No Typology Code available for payment source | Attending: Family Medicine | Admitting: Family Medicine

## 2016-07-19 ENCOUNTER — Encounter (HOSPITAL_COMMUNITY): Payer: Self-pay | Admitting: Emergency Medicine

## 2016-07-19 DIAGNOSIS — S76312D Strain of muscle, fascia and tendon of the posterior muscle group at thigh level, left thigh, subsequent encounter: Secondary | ICD-10-CM

## 2016-07-19 DIAGNOSIS — S76312A Strain of muscle, fascia and tendon of the posterior muscle group at thigh level, left thigh, initial encounter: Secondary | ICD-10-CM

## 2016-07-19 MED ORDER — DICLOFENAC POTASSIUM 50 MG PO TABS: 50.0000 mg | ORAL_TABLET | Freq: Three times a day (TID) | ORAL | 1 refills | Status: DC

## 2016-07-19 NOTE — Discharge Instructions (Signed)
Heat, medication and stretch as discussed. See orthopedist for RTW note.

## 2016-07-19 NOTE — ED Provider Notes (Signed)
[facility]    CSN: [id number] Arrival date & time: 07/19/16  1341  First Provider Contact:  First MD Initiated Contact with Patient 07/19/16 1458        History   Chief Complaint No chief complaint on file.   HPI Debra Vargas is a 45 y.o. female.    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 Blood transfusion without reported diagnosis 1992   after D&C for SAB  . Hypertension     Patient Active Problem List   Diagnosis Date Noted  . Chronic sinusitis 06/17/2013  . Unspecified essential hypertension 06/17/2013    Past Surgical History:  Procedure Laterality Date  . DILATION AND CURETTAGE OF UTERUS  1992  . NOVASURE ABLATION  2014    OB History    Gravida Para Term Preterm AB Living   4 3 3   1 3    SAB TAB Ectopic Multiple Live Births   1       3       Home Medications    Prior to Admission medications   Medication Sig Start Date End Date Taking? Authorizing Provider  amoxicillin-clavulanate (AUGMENTIN) 500-125 MG per tablet Take 1 tablet (500 mg total) by mouth every 8 (eight) hours. Patient not taking: Reported on 03/31/2016 04/21/15   Marlon Peliffany Greene, PA-C  cephALEXin (KEFLEX) 500 MG capsule Take 1 capsule (500 mg total) by mouth 2 (two) times daily. Patient not taking: Reported on 03/31/2016 08/20/15   Earley FavorGail Schulz, NP  Chlorpheniramine-Phenylephrine (SINUS & ALLERGY PO) Take 2 tablets by mouth daily as needed (allergies). Reported on 03/31/2016    Historical Provider, MD  fluticasone (FLONASE) 50 MCG/ACT nasal spray Place 2 sprays into both nostrils daily. 05/31/16   Emi HolesAlexandra M  Law, PA-C  methocarbamol (ROBAXIN) 500 MG tablet Take 1 tablet (500 mg total) by mouth 2 (two) times daily. 07/16/16   Heather Laisure, PA-C  naproxen (NAPROSYN) 500 MG tablet Take 1 tablet (500 mg total) by mouth 2 (two) times daily. 07/16/16   Heather Laisure, PA-C  nitrofurantoin, macrocrystal-monohydrate, (MACROBID) 100 MG capsule Take 1 capsule (100 mg total) by mouth 2 (two) times daily. Patient not taking: Reported on 03/31/2016 09/05/15   Marily MemosJason Mesner, MD  phenazopyridine (PYRIDIUM) 100 MG tablet Take 1 tablet (100 mg total) by mouth 3 (three) times daily with meals. Patient not taking: Reported on 03/31/2016 08/21/15   Earley FavorGail Schulz, NP  pseudoephedrine (SUDAFED) 60 MG tablet Take 1 tablet (60 mg total) by mouth every 6 (six) hours as needed for congestion. 05/31/16   Emi HolesAlexandra M Law, PA-C  traMADol (ULTRAM) 50 MG tablet Take 1 tablet (50 mg total) by mouth every 6 (six) hours as needed. 07/16/16   Santiago GladHeather Laisure, PA-C    Family History Family History  Problem Relation Age of Onset  . Diabetes Mother   . Hypertension Mother   . Thyroid disease Mother   . Hypertension Father   . Diabetes Maternal Aunt   . Stroke Maternal Grandmother     Social History  Social History  Substance Use Topics  . Smoking status: Never Smoker  . Smokeless tobacco: Never Used  . Alcohol use Yes     Comment: socially     Allergies   Review of patient's allergies indicates no known allergies.   Review of Systems Review of Systems  Constitutional: Positive for activity change.  Cardiovascular: Negative for leg swelling.  Gastrointestinal: Negative.   Musculoskeletal: Positive for gait problem, myalgias and stiffness. Negative for back pain and joint swelling.  Skin: Negative.   All other systems reviewed and are negative.    Physical Exam Triage Vital Signs ED Triage Vitals [07/19/16 1450]  Enc Vitals Group     BP 150/79     Pulse Rate 76     Resp 14     Temp 98 F (36.7 C)     Temp Source  Oral     SpO2 99 %     Weight      Height      Head Circumference      Peak Flow      Pain Score      Pain Loc      Pain Edu?      Excl. in GC?    No data found.   Updated Vital Signs BP 150/79 (BP Location: Right Arm)   Pulse 76   Temp 98 F (36.7 C) (Oral)   Resp 14   SpO2 99%   Visual Acuity Right Eye Distance:   Left Eye Distance:   Bilateral Distance:    Right Eye Near:   Left Eye Near:    Bilateral Near:     Physical Exam  Constitutional: She is oriented to person, place, and time. She appears well-developed and well-nourished. No distress.  Musculoskeletal: She exhibits tenderness. She exhibits no deformity.       Left upper leg: She exhibits tenderness. She exhibits no swelling and no edema.       Legs: Neurological: She is alert and oriented to person, place, and time. She has normal reflexes.  Skin: Skin is warm and dry.  Nursing note and vitals reviewed.    UC Treatments / Results  Labs (all labs ordered are listed, but only abnormal results are displayed) Labs Reviewed - No data to display  EKG  EKG Interpretation None       Radiology No results found.  Procedures Procedures (including critical care time)  Medications Ordered in UC Medications - No data to display   Initial Impression / Assessment and Plan / UC Course  I have reviewed the triage vital signs and the nursing notes.  Pertinent labs & imaging results that were available during my care of the patient were reviewed by me and considered in my medical decision making (see chart for details).  Clinical Course      Final Clinical Impressions(s) / UC Diagnoses   Final diagnoses:  None    New Prescriptions New Prescriptions   No medications on file     Linna Hoff, MD 07/19/16 [phone]

## 2016-07-19 NOTE — ED Triage Notes (Signed)
Complains of leg pain that started 9/9.  Patient has used muscle relaxer's and pain pills.

## 2016-08-09 ENCOUNTER — Ambulatory Visit (INDEPENDENT_AMBULATORY_CARE_PROVIDER_SITE_OTHER): Payer: Self-pay | Admitting: Sports Medicine

## 2016-08-09 DIAGNOSIS — M79652 Pain in left thigh: Secondary | ICD-10-CM

## 2017-04-19 ENCOUNTER — Other Ambulatory Visit: Payer: Self-pay | Admitting: Obstetrics and Gynecology

## 2017-04-19 DIAGNOSIS — Z1231 Encounter for screening mammogram for malignant neoplasm of breast: Secondary | ICD-10-CM

## 2017-04-27 ENCOUNTER — Ambulatory Visit (HOSPITAL_COMMUNITY)
Admission: RE | Admit: 2017-04-27 | Discharge: 2017-04-27 | Disposition: A | Discharge status: Home / Self Care | Payer: Self-pay | Source: Ambulatory Visit | Attending: Obstetrics and Gynecology | Admitting: Obstetrics and Gynecology

## 2017-04-27 ENCOUNTER — Ambulatory Visit
Admission: RE | Admit: 2017-04-27 | Discharge: 2017-04-27 | Disposition: A | Discharge status: Home / Self Care | Payer: No Typology Code available for payment source | Source: Ambulatory Visit | Attending: Obstetrics and Gynecology | Admitting: Obstetrics and Gynecology

## 2017-04-27 ENCOUNTER — Other Ambulatory Visit (HOSPITAL_COMMUNITY): Payer: Self-pay | Admitting: Diagnostic Radiology

## 2017-04-27 ENCOUNTER — Other Ambulatory Visit (HOSPITAL_COMMUNITY): Payer: Self-pay | Admitting: *Deleted

## 2017-04-27 ENCOUNTER — Encounter (HOSPITAL_COMMUNITY): Payer: Self-pay

## 2017-04-27 ENCOUNTER — Other Ambulatory Visit (HOSPITAL_COMMUNITY): Payer: Self-pay | Admitting: Obstetrics and Gynecology

## 2017-04-27 VITALS — BP 164/110 | Ht 61.0 in | Wt 196.4 lb

## 2017-04-27 DIAGNOSIS — N63 Unspecified lump in unspecified breast: Secondary | ICD-10-CM

## 2017-04-27 DIAGNOSIS — N6325 Unspecified lump in the left breast, overlapping quadrants: Secondary | ICD-10-CM

## 2017-04-27 DIAGNOSIS — Z1231 Encounter for screening mammogram for malignant neoplasm of breast: Secondary | ICD-10-CM

## 2017-04-27 DIAGNOSIS — N6320 Unspecified lump in the left breast, unspecified quadrant: Secondary | ICD-10-CM

## 2017-04-27 DIAGNOSIS — N6321 Unspecified lump in the left breast, upper outer quadrant: Secondary | ICD-10-CM

## 2017-04-27 DIAGNOSIS — Z01419 Encounter for gynecological examination (general) (routine) without abnormal findings: Secondary | ICD-10-CM

## 2017-04-27 DIAGNOSIS — N6311 Unspecified lump in the right breast, upper outer quadrant: Secondary | ICD-10-CM

## 2017-04-27 DIAGNOSIS — N6322 Unspecified lump in the left breast, upper inner quadrant: Secondary | ICD-10-CM

## 2017-04-27 NOTE — Progress Notes (Signed)
Complaints of bilateral breast lumps x 16 months that per patient are the same size as prior to last mammogram 03/31/2016.  Pap Smear: Pap smear completed today. Last Pap smear was in May 2015 at [facility] and normal per patient. Per patient has no history of an abnormal Pap smear. No Pap smear results are in EPIC.  Physical exam: Breasts Breasts symmetrical. No skin abnormalities bilateral breasts. No nipple retraction bilateral breasts. No nipple discharge bilateral breasts. No lymphadenopathy. Palpated a cluster of lumps within the right breast at 10 o'clock 5 cm from the nipple. Palpated three lumps within the left breast at 2 o'clock 3 cm from the nipple, 12 o'clock 2 cm from the nipple, and 10 o'clock 5 cm from the nipple. No complaints of pain or tenderness on exam. [facility] and possible left breast ultrasound. Appointment scheduled for Thursday, April 27, 2017 at 1610.  Pelvic/Bimanual   Ext Genitalia No lesions, no swelling and no discharge observed on external genitalia.         Vagina Vagina pink and normal texture. No lesions or discharge observed in vagina.          Cervix Cervix is present. Cervix pink and of normal texture. No discharge observed.     Uterus Uterus is present and palpable. Uterus in normal position and normal size.        Adnexae Bilateral ovaries present and palpable. No tenderness on palpation.          Rectovaginal No rectal exam completed today since patient had no rectal complaints. No skin abnormalities observed on exam.    Smoking History: Patient has never smoked.  Patient Navigation: Patient education provided. Access to services provided for [facility] through [facility].

## 2017-04-27 NOTE — Patient Instructions (Signed)
Explained breast self awareness with Debra Vargas. Let patient know [facility] will cover Pap smears and HPV typing every 5 years unless has a history of abnormal Pap smears. [facility] and possible left breast ultrasound. Appointment scheduled for Thursday, April 27, 2017 at 1610. Let patient know will follow up with her within the next couple weeks with results of Pap smear by letter or phone. Debra Parisracy D Mcknight verbalized understanding.  [place], Kathaleen Maserhristine Poll, RN 3:40 PM

## 2017-04-28 ENCOUNTER — Encounter (HOSPITAL_COMMUNITY): Payer: Self-pay | Admitting: *Deleted

## 2017-05-03 LAB — CYTOLOGY - PAP
DIAGNOSIS: NEGATIVE
HPV: NOT DETECTED

## 2017-05-05 ENCOUNTER — Other Ambulatory Visit (HOSPITAL_COMMUNITY): Payer: Self-pay | Admitting: *Deleted

## 2017-05-05 ENCOUNTER — Telehealth (HOSPITAL_COMMUNITY): Payer: Self-pay | Admitting: *Deleted

## 2017-05-05 DIAGNOSIS — B379 Candidiasis, unspecified: Secondary | ICD-10-CM

## 2017-05-05 MED ORDER — FLUCONAZOLE 150 MG PO TABS: 150.0000 mg | ORAL_TABLET | Freq: Once | ORAL | 0 refills | Status: AC

## 2017-05-05 NOTE — Telephone Encounter (Signed)
Telephoned patient at home number and left message to return call to [facility]. Medication for yeast infection called to pharmacy.

## 2017-05-12 ENCOUNTER — Other Ambulatory Visit (HOSPITAL_COMMUNITY): Payer: Self-pay | Admitting: *Deleted

## 2017-05-12 DIAGNOSIS — B379 Candidiasis, unspecified: Secondary | ICD-10-CM

## 2017-05-12 MED ORDER — FLUCONAZOLE 150 MG PO TABS: 150.0000 mg | ORAL_TABLET | Freq: Once | ORAL | 0 refills | Status: AC

## 2017-05-12 NOTE — Progress Notes (Signed)
[facility] Advised patient of negative pap smear results. HPV was negative. Next pap smear due in five years. Pap did show yeast and advised patient medication was called into pharmacy. Patient voiced understanding.

## 2018-11-12 DIAGNOSIS — D251 Intramural leiomyoma of uterus: Secondary | ICD-10-CM | POA: Insufficient documentation

## 2018-11-12 DIAGNOSIS — N926 Irregular menstruation, unspecified: Secondary | ICD-10-CM | POA: Insufficient documentation

## 2018-12-28 ENCOUNTER — Other Ambulatory Visit: Payer: Self-pay | Admitting: *Deleted

## 2018-12-28 ENCOUNTER — Other Ambulatory Visit: Payer: Self-pay | Admitting: Family Medicine

## 2018-12-28 DIAGNOSIS — Z1231 Encounter for screening mammogram for malignant neoplasm of breast: Secondary | ICD-10-CM

## 2019-01-18 ENCOUNTER — Ambulatory Visit
Admission: RE | Admit: 2019-01-18 | Discharge: 2019-01-18 | Disposition: A | Discharge status: Home / Self Care | Payer: 59 | Source: Ambulatory Visit | Attending: Nurse Practitioner | Admitting: Nurse Practitioner

## 2019-01-18 ENCOUNTER — Other Ambulatory Visit: Payer: Self-pay

## 2019-01-18 DIAGNOSIS — Z1231 Encounter for screening mammogram for malignant neoplasm of breast: Secondary | ICD-10-CM

## 2019-12-05 ENCOUNTER — Other Ambulatory Visit (HOSPITAL_COMMUNITY): Payer: Self-pay

## 2019-12-05 ENCOUNTER — Ambulatory Visit: Payer: 59 | Attending: Internal Medicine

## 2019-12-05 DIAGNOSIS — Z20822 Contact with and (suspected) exposure to covid-19: Secondary | ICD-10-CM | POA: Insufficient documentation

## 2019-12-05 DIAGNOSIS — N63 Unspecified lump in unspecified breast: Secondary | ICD-10-CM

## 2019-12-06 LAB — NOVEL CORONAVIRUS, NAA: SARS-CoV-2, NAA: NOT DETECTED

## 2019-12-23 ENCOUNTER — Other Ambulatory Visit: Payer: Self-pay

## 2019-12-23 DIAGNOSIS — N6320 Unspecified lump in the left breast, unspecified quadrant: Secondary | ICD-10-CM

## 2019-12-24 ENCOUNTER — Ambulatory Visit: Payer: No Typology Code available for payment source

## 2019-12-25 ENCOUNTER — Other Ambulatory Visit: Payer: Self-pay

## 2020-01-16 ENCOUNTER — Ambulatory Visit: Payer: Self-pay | Admitting: Student

## 2020-01-16 ENCOUNTER — Other Ambulatory Visit: Payer: Self-pay

## 2020-01-16 VITALS — BP 164/100 | Temp 97.8°F | Wt 209.0 lb

## 2020-01-16 DIAGNOSIS — Z1231 Encounter for screening mammogram for malignant neoplasm of breast: Secondary | ICD-10-CM

## 2020-01-16 NOTE — Progress Notes (Signed)
[name] is a 49 y.o. female who presents to [facility] clinic today with no complaints. She has had lumps in her breast for 2-3 years and they have always been benign.  She feels them now; they do not hurt.    Pap Smear: Pap not smear completed today. Last Pap smear was 04/27/2017 and normal with negative HPV.  Per patient has no history of an abnormal Pap smear. Last Pap smear result is not available in Epic.   Physical exam: Breasts Breasts symmetrical. No skin abnormalities bilateral breasts. No nipple retraction bilateral breasts. No nipple discharge bilateral breasts. No lymphadenopathy. Lump in left breast palpated at 3oclock. Does not have discrete borders; is moveable. Non-tender.    Pelvic/Bimanual Pap is not indicated today    Smoking History: Patient has never smoked    Patient Navigation: Patient education provided. Access to services provided for patient through [facility] program. Colorectal Cancer Screening: Per patient has never had colonoscopy completed No complaints today.    Breast and Cervical Cancer Risk Assessment: Patient does not have family history of breast cancer, known genetic mutations, or radiation treatment to the chest before age 26. Patient does not have history of cervical dysplasia, immunocompromised, or DES exposure in-utero.  Risk Assessment    Risk Scores      01/16/2020   Last edited by: Narda Rutherford, LPN   5-year risk: 0.8 %   Lifetime risk: 6.8 %          A: [facility] exam without pap smear Complaint of lumps in her breast, which are typical for her.   P: Referred patient to the [facility] for a diagnostic mammogram. Appointment scheduled March 15 at 12:40.  Marylene Land, CNM 01/16/2020 1:06 PM

## 2020-01-20 ENCOUNTER — Ambulatory Visit
Admission: RE | Admit: 2020-01-20 | Discharge: 2020-01-20 | Disposition: A | Discharge status: Home / Self Care | Payer: No Typology Code available for payment source | Source: Ambulatory Visit | Attending: Obstetrics and Gynecology | Admitting: Obstetrics and Gynecology

## 2020-01-20 ENCOUNTER — Other Ambulatory Visit: Payer: Self-pay

## 2020-01-20 DIAGNOSIS — N6320 Unspecified lump in the left breast, unspecified quadrant: Secondary | ICD-10-CM

## 2020-03-29 IMAGING — US US BREAST*L* LIMITED INC AXILLA
1 series · 9 of 9 positions shown · non-contrast
Comparison: Previous exam(s).

CLINICAL DATA: 48-year-old patient with palpable lump in the 2
o'clock axis of the left breast. Known history of breast cysts in
the past.

EXAM:
DIGITAL DIAGNOSTIC BILATERAL MAMMOGRAM WITH CAD AND TOMO
ULTRASOUND LEFT BREAST

[Series 1: us breast*left* limited inc axilla · 0.07mm/px · 9 of 9 slices shown]
[im 1/9]
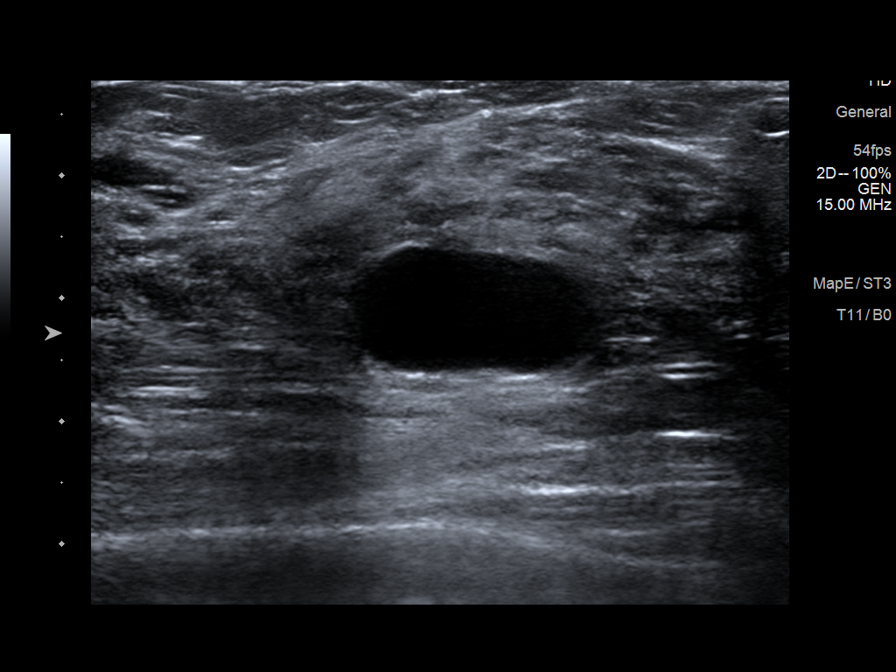
[im 2/9]
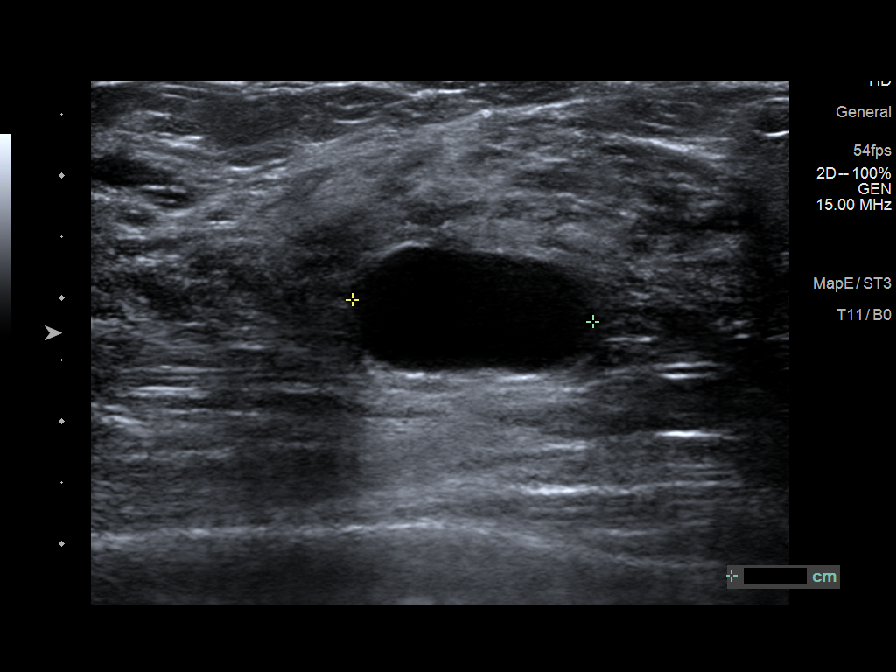
[im 3/9]
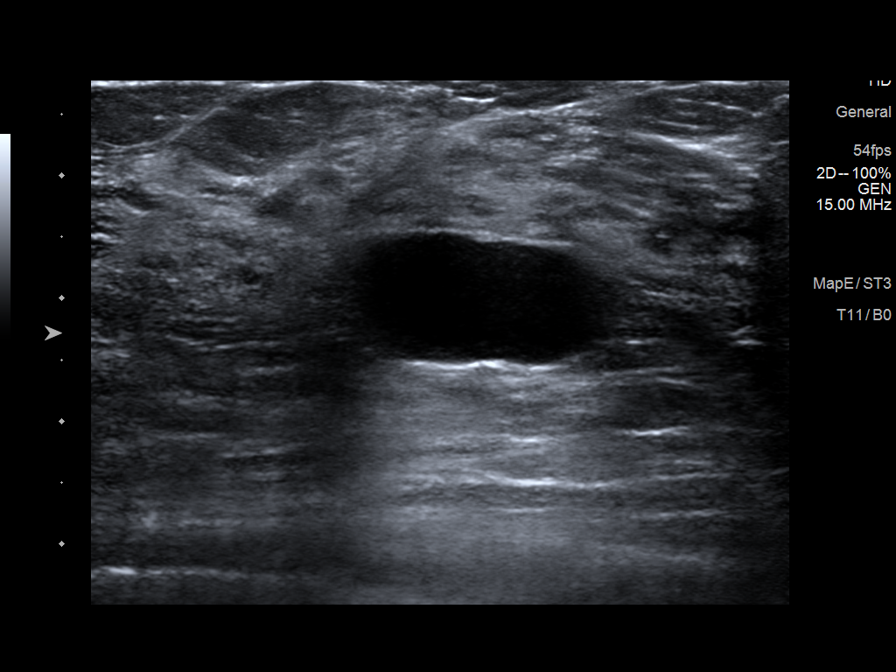
[im 4/9]
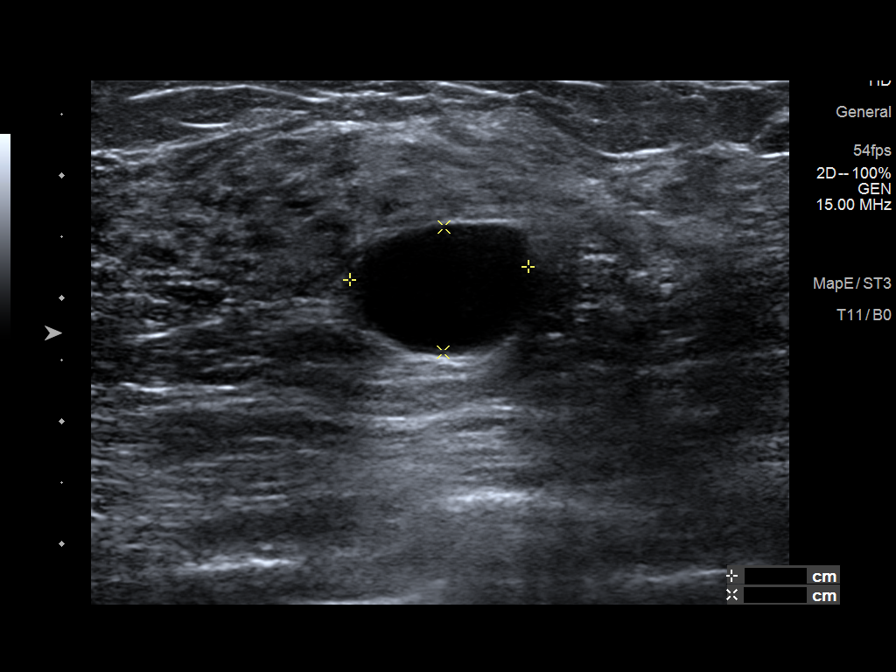
[im 5/9]
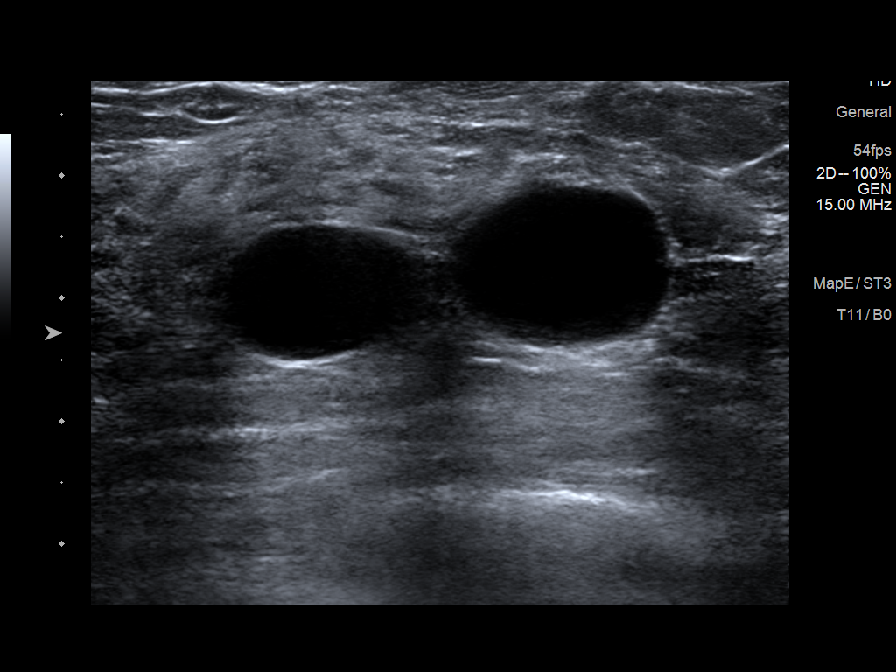
[im 6/9]
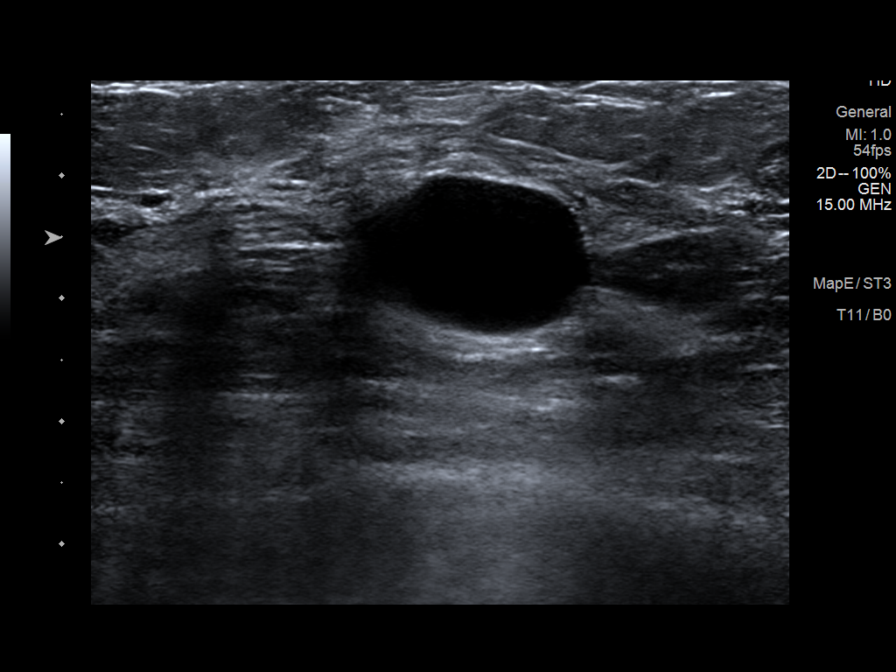
[im 7/9]
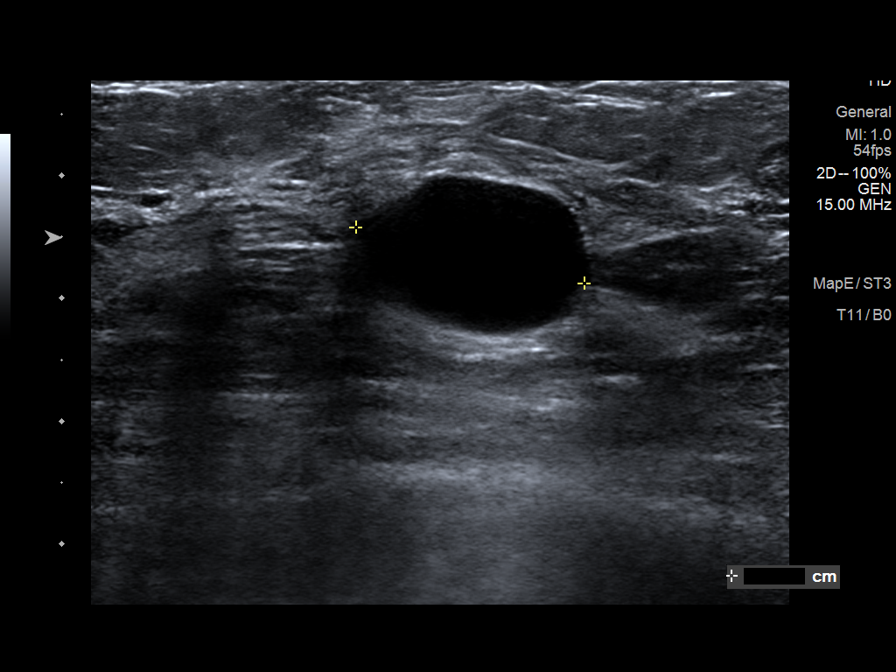
[im 8/9]
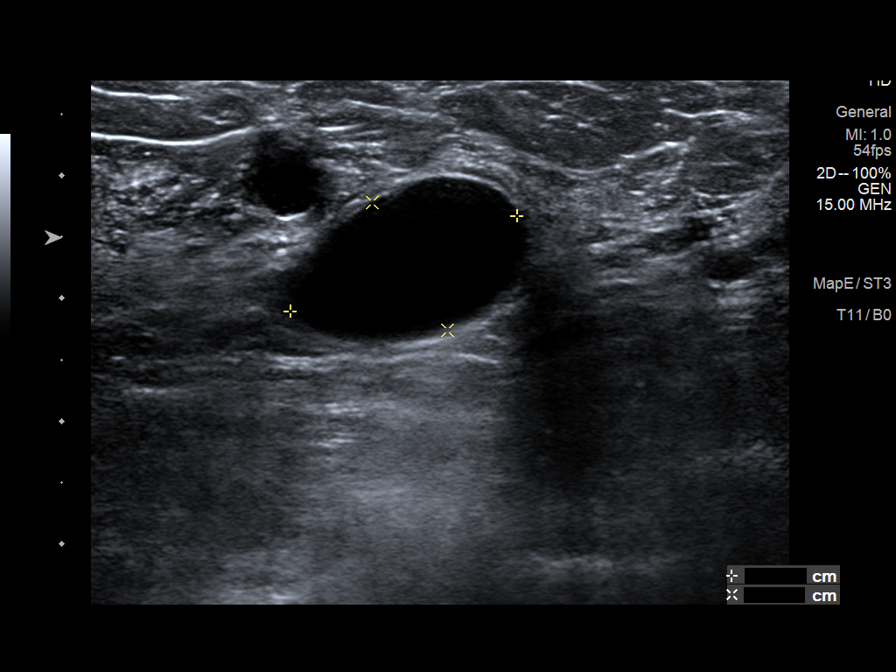
[im 9/9]
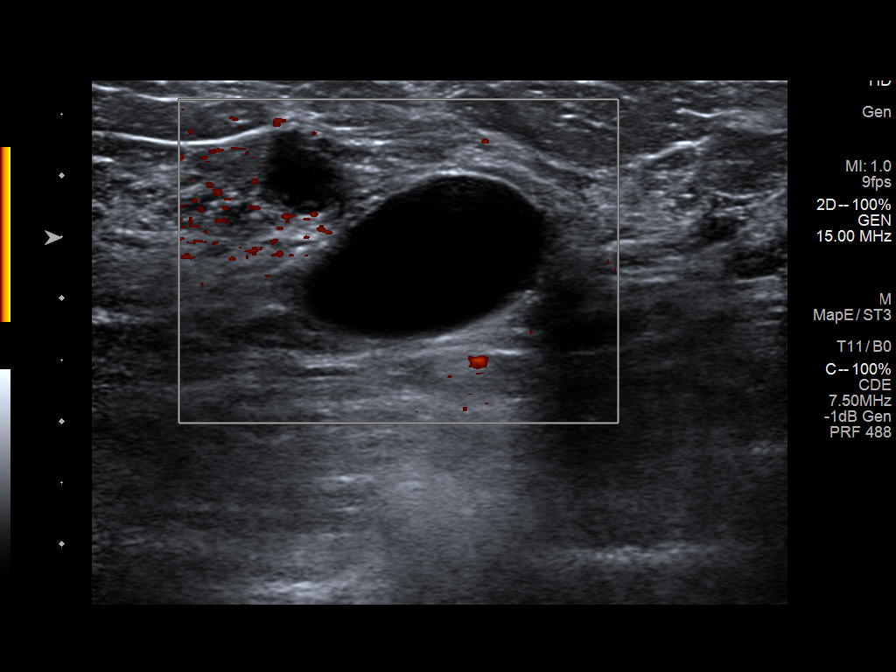

[9 of 9 positions shown; findings below may reference images not displayed]

ACR Breast Density Category d: The breast tissue is extremely dense,
which lowers the sensitivity of mammography.
FINDINGS: Bilateral partially circumscribed and partially obscured masses,
similar to prior exams. In the region of palpable concern, a spot
tangential view shows several partially obscured and partially
circumscribed masses.

Mammographic images were processed with CAD.

On physical exam, I palpate soft slightly mobile mass in the 2
o'clock position of the left breast 5 cm from the nipple measuring
approximately 2 cm.

Targeted ultrasound is performed, showing 2 dominant cysts in the 2
o'clock axis 4 cm from the nipple. The largest measures 2.0 x 1.2 x
1.9 cm and a second cyst measures 2.0 x 1.5 x 1.0 cm.
IMPRESSION: Palpable simple cysts 2 o'clock position left breast. No evidence of
malignancy in either breast. Extremely dense breast tissue
bilaterally.

RECOMMENDATION:
Screening mammogram in one year.(Code:PJ-Z-VG4)

I have discussed the findings and recommendations with the patient.
If applicable, a reminder letter will be sent to the patient
regarding the next appointment.

BI-RADS CATEGORY  2: Benign.

## 2020-03-29 IMAGING — MG DIGITAL DIAGNOSTIC BILAT W/ TOMO W/ CAD
6 of 10 series · 6 of 30 positions shown · non-contrast
Comparison: Previous exam(s).

CLINICAL DATA: 48-year-old patient with palpable lump in the 2
o'clock axis of the left breast. Known history of breast cysts in
the past.

EXAM:
DIGITAL DIAGNOSTIC BILATERAL MAMMOGRAM WITH CAD AND TOMO
ULTRASOUND LEFT BREAST

[R CC synth-2D]
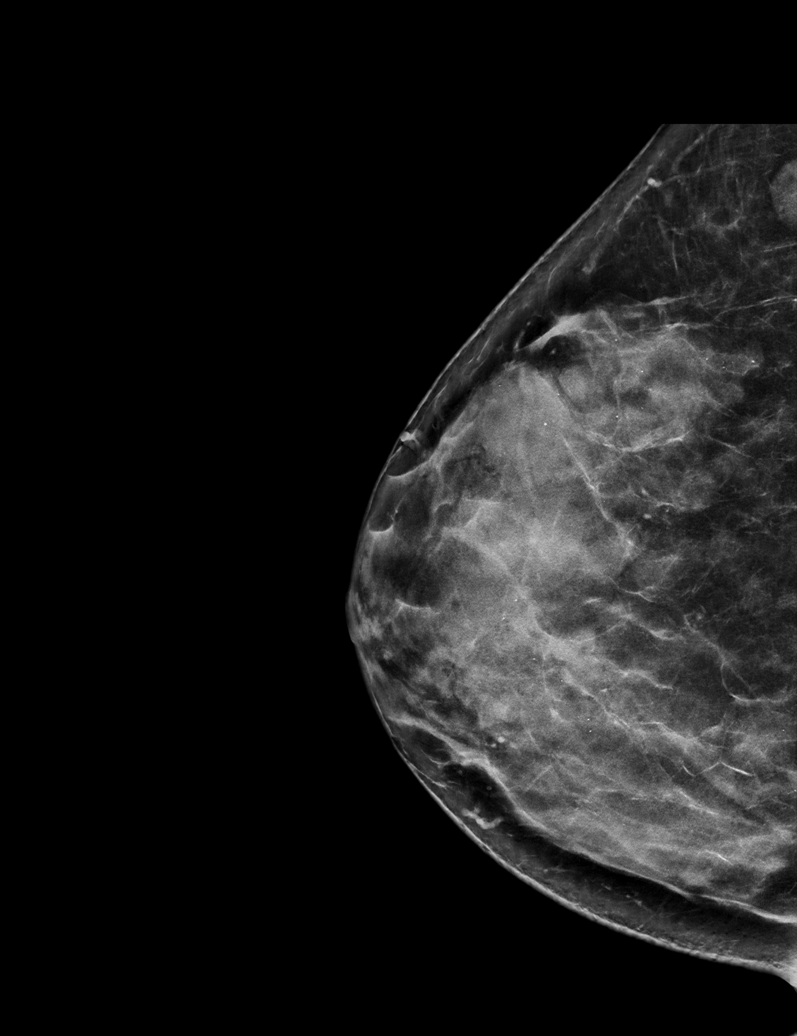

[L MLO synth-2D]
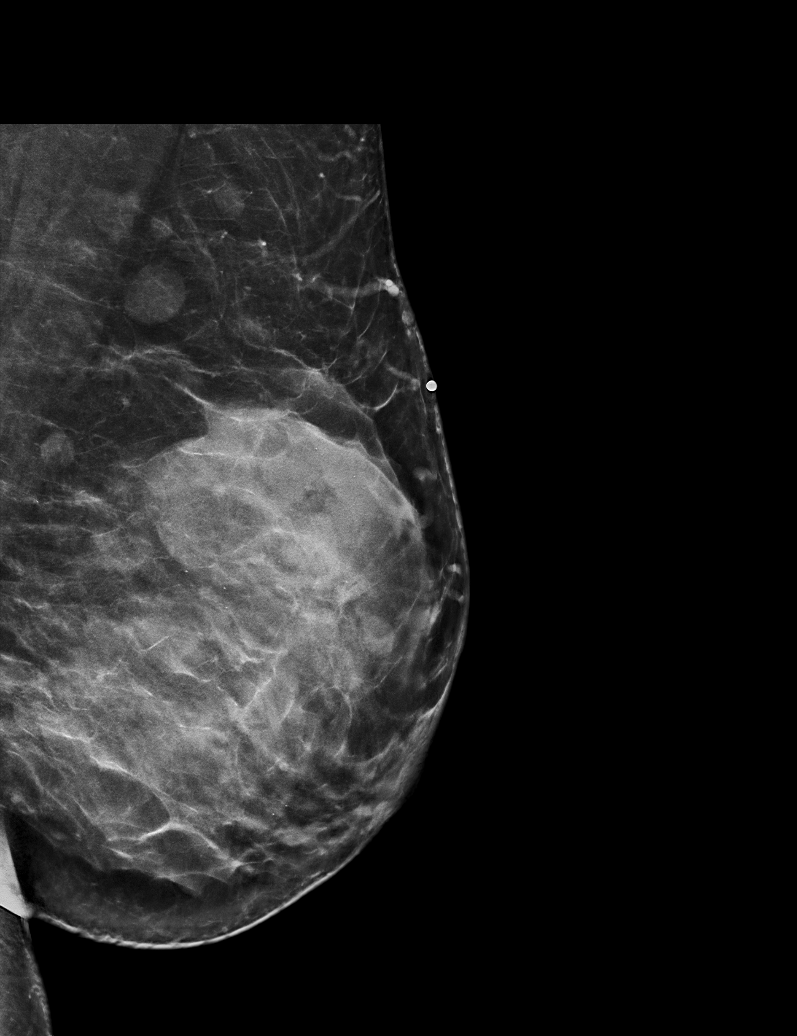

[L TAN synth-2D]
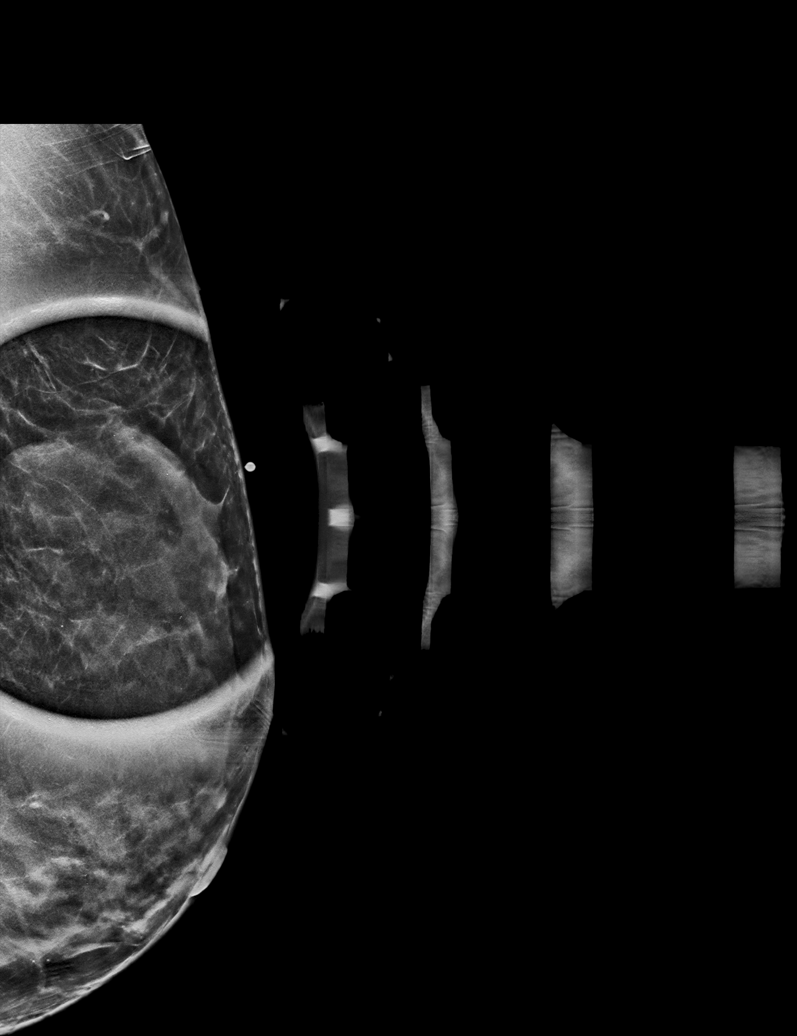

[L CC synth-2D]
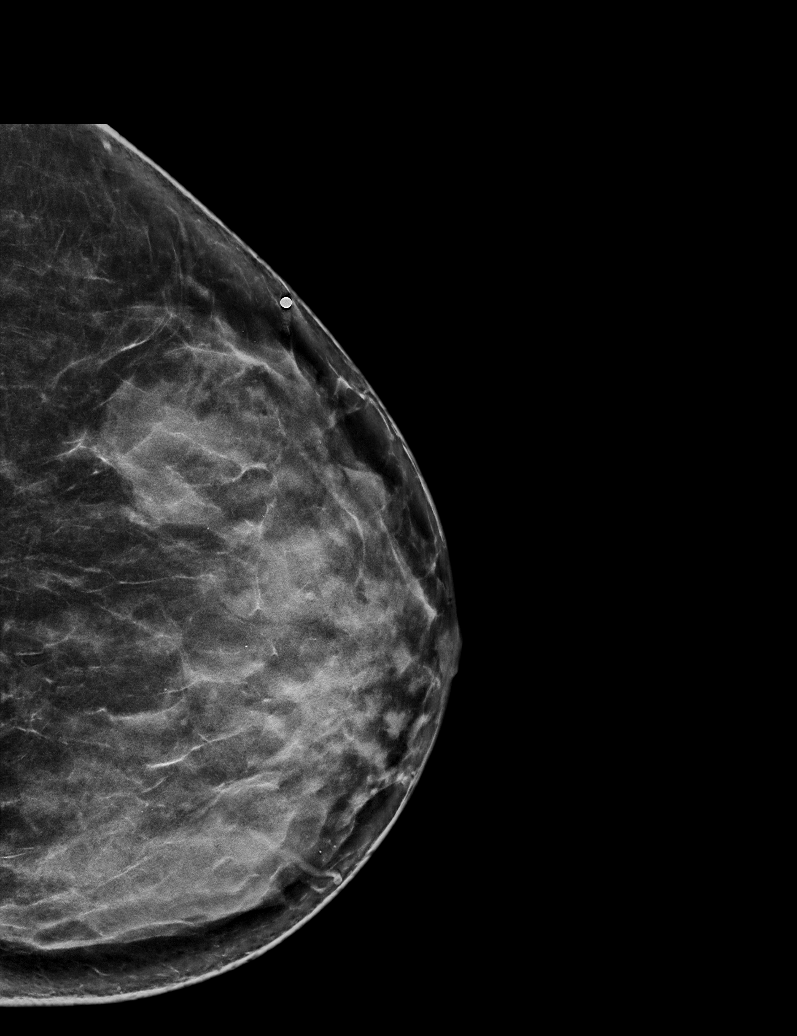

[R MLO synth-2D]
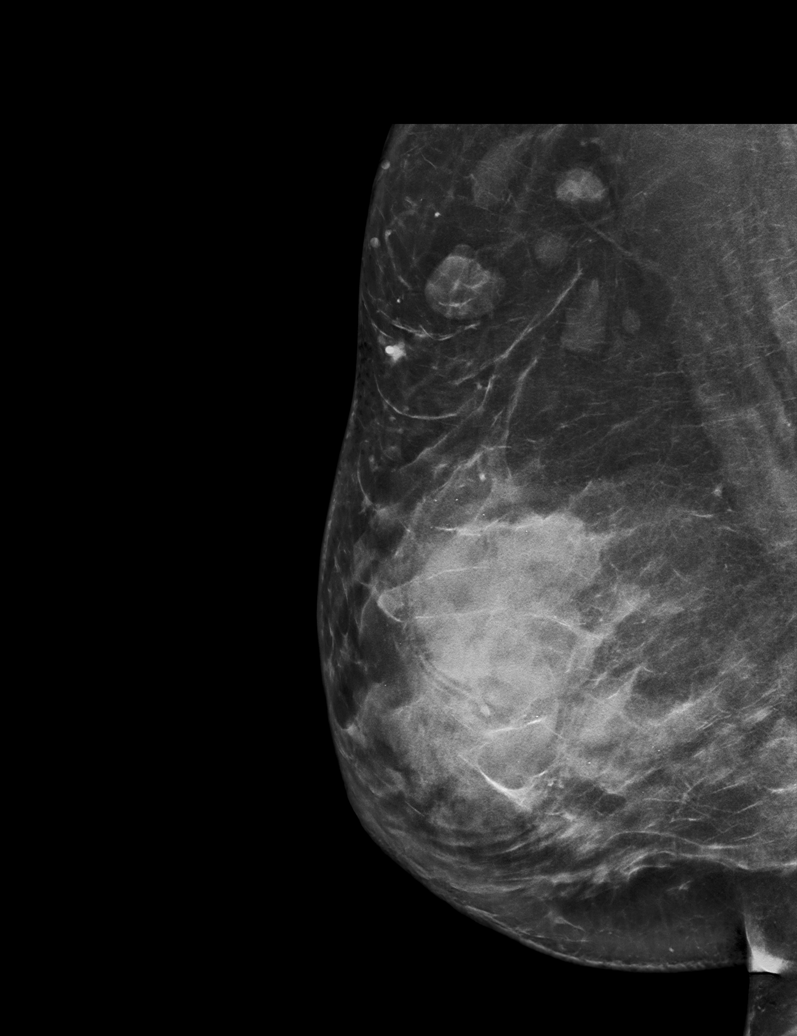

[L CC tomo · tomo slice 33/66.0]
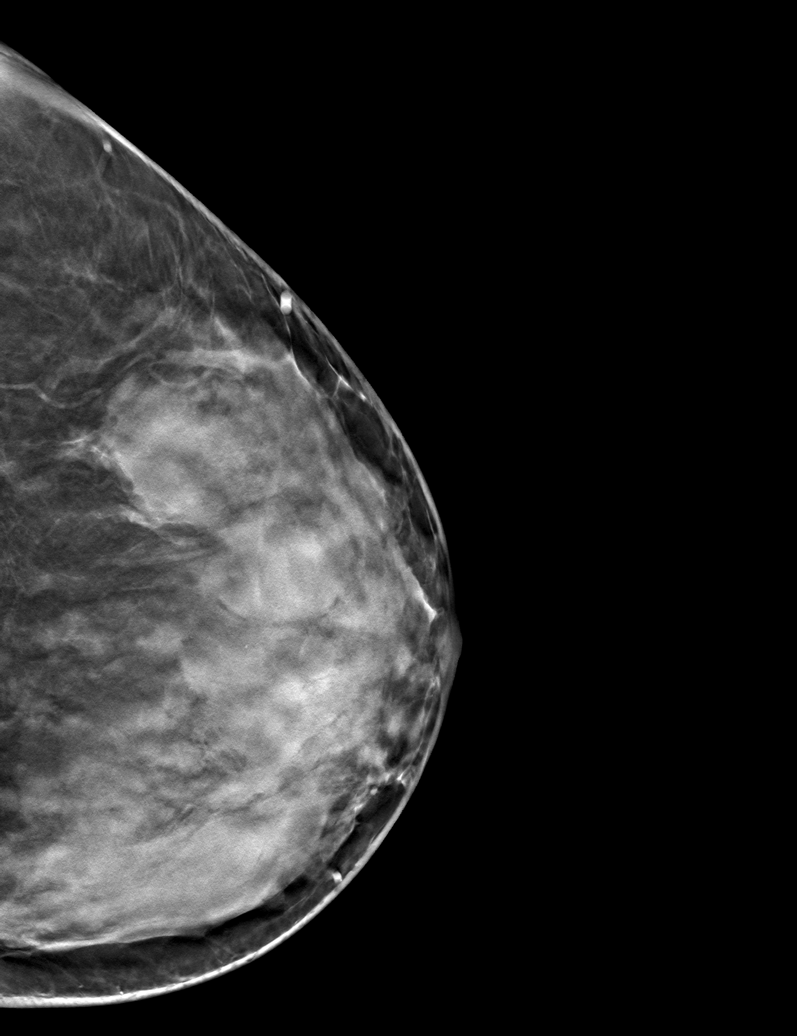

[6 of 30 positions shown; findings below may reference images not displayed]

ACR Breast Density Category d: The breast tissue is extremely dense,
which lowers the sensitivity of mammography.
FINDINGS: Bilateral partially circumscribed and partially obscured masses,
similar to prior exams. In the region of palpable concern, a spot
tangential view shows several partially obscured and partially
circumscribed masses.

Mammographic images were processed with CAD.

On physical exam, I palpate soft slightly mobile mass in the 2
o'clock position of the left breast 5 cm from the nipple measuring
approximately 2 cm.

Targeted ultrasound is performed, showing 2 dominant cysts in the 2
o'clock axis 4 cm from the nipple. The largest measures 2.0 x 1.2 x
1.9 cm and a second cyst measures 2.0 x 1.5 x 1.0 cm.
IMPRESSION: Palpable simple cysts 2 o'clock position left breast. No evidence of
malignancy in either breast. Extremely dense breast tissue
bilaterally.

RECOMMENDATION:
Screening mammogram in one year.(Code:PJ-Z-VG4)

I have discussed the findings and recommendations with the patient.
If applicable, a reminder letter will be sent to the patient
regarding the next appointment.

BI-RADS CATEGORY  2: Benign.

## 2021-09-23 ENCOUNTER — Emergency Department (HOSPITAL_COMMUNITY)
Admission: EM | Admit: 2021-09-23 | Discharge: 2021-09-23 | Disposition: A | Discharge status: Home / Self Care | Payer: No Typology Code available for payment source | Attending: Emergency Medicine | Admitting: Emergency Medicine

## 2021-09-23 ENCOUNTER — Encounter (HOSPITAL_COMMUNITY): Payer: Self-pay

## 2021-09-23 DIAGNOSIS — U071 COVID-19: Secondary | ICD-10-CM | POA: Insufficient documentation

## 2021-09-23 DIAGNOSIS — I10 Essential (primary) hypertension: Secondary | ICD-10-CM | POA: Insufficient documentation

## 2021-09-23 DIAGNOSIS — H9203 Otalgia, bilateral: Secondary | ICD-10-CM | POA: Insufficient documentation

## 2021-09-23 LAB — RESP PANEL BY RT-PCR (FLU A&B, COVID) ARPGX2
Influenza A by PCR: NEGATIVE
Influenza B by PCR: NEGATIVE
SARS Coronavirus 2 by RT PCR: POSITIVE — AB

## 2021-09-23 MED ORDER — IBUPROFEN 800 MG PO TABS
800.0000 mg | ORAL_TABLET | Freq: Once | ORAL | Status: AC
  Administered 2021-09-23: 10:00:00 800 mg via ORAL
  Filled 2021-09-23: qty 1

## 2021-09-23 NOTE — ED Triage Notes (Signed)
Pt presents with c/o generalized body aches, left ear pain, and a dry cough with a sore throat since Tuesday night.

## 2021-09-23 NOTE — Discharge Instructions (Addendum)
You were evaluated in the Emergency Department and after careful evaluation, we did not find any emergent condition requiring admission or further testing in the hospital.  Your exam today was overall reassuring. You have tested positive for COVID 19. As you do not have an oxygen requirement, you can continue to manage this at home.   Please return to the Emergency Department if you experience any worsening of your condition.  Thank you for allowing [place] to be a part of your care.

## 2021-09-23 NOTE — ED Notes (Signed)
Pt ambulate around the room 97%

## 2021-09-23 NOTE — ED Provider Notes (Signed)
[place] DEPT Provider Note   CSN: [id number] Arrival date & time: 09/23/21  [id number]     History Chief Complaint  Patient presents with   Generalized Body Aches   Sore Throat   Otalgia    Debra Vargas is a 50 y.o. female.   Sore Throat Pertinent negatives include no chest pain, no abdominal pain and no shortness of breath.  Otalgia Associated symptoms: cough and sore throat   Associated symptoms: no abdominal pain, no fever, no rash and no vomiting    50 year old female with medical history significant for hypertension presenting to the emergency department with 4 days of sore throat, bilateral otalgia, generalized body aches, mildly productive cough.  The patient denies any known sick contacts.  She endorses generalized fatigue and malaise.  She does endorse some changes in taste and smell.  Past Medical History:  Diagnosis Date   Blood transfusion without reported diagnosis 1992   after D&C for SAB   Hypertension     Patient Active Problem List   Diagnosis Date Noted   Chronic sinusitis 06/17/2013   Unspecified essential hypertension 06/17/2013    Past Surgical History:  Procedure Laterality Date   DILATION AND CURETTAGE OF UTERUS  1992   [place] ABLATION  2014     OB History     Gravida  4   Para  3   Term  3   Preterm      AB  1   Living  3      SAB  1   IAB      Ectopic      Multiple      Live Births  3           Family History  Problem Relation Age of Onset   Diabetes Mother    Hypertension Mother    Thyroid disease Mother    Hypertension Father    Diabetes Maternal Aunt    Stroke Maternal Grandmother    Hypertension Sister    Hypertension Brother     Social History   Tobacco Use   Smoking status: Never   Smokeless tobacco: Never  Vaping Use   Vaping Use: Never used  Substance Use Topics   Alcohol use: Yes    Comment: socially   Drug use: No    Home Medications Prior to  Admission medications   Medication Sig Start Date End Date Taking? Authorizing Provider  amoxicillin-clavulanate (AUGMENTIN) 500-125 MG per tablet Take 1 tablet (500 mg total) by mouth every 8 (eight) hours. Patient not taking: Reported on 03/31/2016 04/21/15   Delos Haring, PA-C  cephALEXin (KEFLEX) 500 MG capsule Take 1 capsule (500 mg total) by mouth 2 (two) times daily. Patient not taking: Reported on 03/31/2016 08/20/15   Junius Creamer, NP  Chlorpheniramine-Phenylephrine (SINUS & ALLERGY PO) Take 2 tablets by mouth daily as needed (allergies). Reported on 03/31/2016    [provider]  diclofenac (CATAFLAM) 50 MG tablet Take 1 tablet (50 mg total) by mouth 3 (three) times daily. Patient not taking: Reported on 04/27/2017 07/19/16   Billy Fischer, MD  fluticasone [facility]) 50 MCG/ACT nasal spray Place 2 sprays into both nostrils daily. Patient not taking: Reported on 04/27/2017 05/31/16   Frederica Kuster, PA-C  methocarbamol (ROBAXIN) 500 MG tablet Take 1 tablet (500 mg total) by mouth 2 (two) times daily. Patient not taking: Reported on 04/27/2017 07/16/16   Hyman Bible, PA-C  naproxen (NAPROSYN) 500 MG tablet Take  1 tablet (500 mg total) by mouth 2 (two) times daily. Patient not taking: Reported on 04/27/2017 07/16/16   Hyman Bible, PA-C  nitrofurantoin, macrocrystal-monohydrate, (MACROBID) 100 MG capsule Take 1 capsule (100 mg total) by mouth 2 (two) times daily. Patient not taking: Reported on 03/31/2016 09/05/15   Mesner, Corene Cornea, MD  phenazopyridine (PYRIDIUM) 100 MG tablet Take 1 tablet (100 mg total) by mouth 3 (three) times daily with meals. Patient not taking: Reported on 03/31/2016 08/21/15   Junius Creamer, NP  pseudoephedrine (SUDAFED) 60 MG tablet Take 1 tablet (60 mg total) by mouth every 6 (six) hours as needed for congestion. Patient not taking: Reported on 04/27/2017 05/31/16   Frederica Kuster, PA-C  traMADol (ULTRAM) 50 MG tablet Take 1 tablet (50 mg total) by mouth  every 6 (six) hours as needed. Patient not taking: Reported on 04/27/2017 07/16/16   Hyman Bible, PA-C    Allergies    Patient has no known allergies.  Review of Systems   Review of Systems  Constitutional:  Positive for fatigue. Negative for chills and fever.  HENT:  Positive for ear pain and sore throat.   Eyes:  Negative for pain and visual disturbance.  Respiratory:  Positive for cough. Negative for shortness of breath.   Cardiovascular:  Negative for chest pain and palpitations.  Gastrointestinal:  Positive for nausea. Negative for abdominal pain and vomiting.  Genitourinary:  Negative for dysuria and hematuria.  Musculoskeletal:  Positive for myalgias. Negative for arthralgias and back pain.  Skin:  Negative for color change and rash.  Neurological:  Negative for seizures and syncope.  All other systems reviewed and are negative.  Physical Exam Updated Vital Signs BP (!) 186/113 (BP Location: Left Arm) Comment: informed the nurse of b/p  Pulse (!) 115   Temp 99.7 F (37.6 C) (Oral)   Resp 20   SpO2 99%   Physical Exam Vitals and nursing note reviewed.  Constitutional:      General: She is not in acute distress.    Appearance: She is well-developed. She is not ill-appearing.  HENT:     Head: Normocephalic and atraumatic.     Right Ear: Tympanic membrane normal.     Left Ear: Tympanic membrane normal.     Nose: No congestion or rhinorrhea.     Mouth/Throat:     Pharynx: Posterior oropharyngeal erythema present. No pharyngeal swelling.     Tonsils: No tonsillar exudate or tonsillar abscesses.  Eyes:     Conjunctiva/sclera: Conjunctivae normal.  Cardiovascular:     Rate and Rhythm: Normal rate and regular rhythm.     Heart sounds: No murmur heard. Pulmonary:     Effort: Pulmonary effort is normal. No respiratory distress.     Breath sounds: Normal breath sounds.  Abdominal:     Palpations: Abdomen is soft.     Tenderness: There is no abdominal tenderness.   Musculoskeletal:        General: No swelling.     Cervical back: Neck supple.  Skin:    General: Skin is warm and dry.     Capillary Refill: Capillary refill takes less than 2 seconds.  Neurological:     Mental Status: She is alert.  Psychiatric:        Mood and Affect: Mood normal.    ED Results / Procedures / Treatments   Labs (all labs ordered are listed, but only abnormal results are displayed) Labs Reviewed  RESP PANEL BY RT-PCR (FLU A&B, COVID) [id number] -  Abnormal; Notable for the following components:      Result Value   SARS Coronavirus 2 by RT PCR POSITIVE (*)    All other components within normal limits    EKG None  Radiology No results found.  Procedures Procedures   Medications Ordered in ED Medications  ibuprofen (ADVIL) tablet 800 mg (800 mg Oral Given 09/23/21 0941)    ED Course  I have reviewed the triage vital signs and the nursing notes.  Pertinent labs & imaging results that were available during my care of the patient were reviewed by me and considered in my medical decision making (see chart for details).    MDM Rules/Calculators/A&P                           Debra Vargas is a 50 y.o. female who presents to the ED with a 4 day history of fever, rhinorrhea, and nasal congestion, sore throat, otalgia.  On my exam, the patient is well-appearing and well-hydrated.  The patient's lungs are clear to auscultation bilaterally. Additionally, the patient has a soft/non-tender abdomen, clear tympanic membranes, and no oropharyngeal exudates.  There are no signs of meningismus.  I see no signs of an acute bacterial infection.  The patient's presentation is most consistent with a viral upper respiratory infection.  I have a low suspicion for pneumonia as the patient's cough has been non-productive and the patient is neither tachypneic nor hypoxic on room air.  Additionally, the patient is CTAB.  Influenza/Influenza-Like Illness is possible, especially  considering the current prevalence of disease. I discussed the risks and benefits of antiviral therapy.  COVID-19 and influenza PCR testing was collected and resulted positive for COVID-19.   An ambulatory pulse ox was performed and the patient had no desaturations on room air.  She is overall well-appearing.  The patient is within 5 days of symptom onset. The risks and benefits of treatment with Paxlovid were discussed with the patient.  The patient declined treatment with Paxlovid.   I discussed symptomatic management, including hydration, motrin, and tylenol. The patient felt safe being discharged from the ED.  They agreed to followup with the PCP if needed.  I provided ED return precautions.  Final Clinical Impression(s) / ED Diagnoses Final diagnoses:  COVID-19    Rx / DC Orders ED Discharge Orders     None        Ernie Avena, MD 09/23/21 1056

## 2021-12-29 ENCOUNTER — Emergency Department (HOSPITAL_COMMUNITY)
Admission: EM | Admit: 2021-12-29 | Discharge: 2021-12-29 | Disposition: A | Discharge status: Home / Self Care | Payer: Federal, State, Local not specified - PPO | Attending: Emergency Medicine | Admitting: Emergency Medicine

## 2021-12-29 DIAGNOSIS — S39012A Strain of muscle, fascia and tendon of lower back, initial encounter: Secondary | ICD-10-CM | POA: Insufficient documentation

## 2021-12-29 DIAGNOSIS — Y990 Civilian activity done for income or pay: Secondary | ICD-10-CM | POA: Diagnosis not present

## 2021-12-29 DIAGNOSIS — M62838 Other muscle spasm: Secondary | ICD-10-CM

## 2021-12-29 DIAGNOSIS — S3992XA Unspecified injury of lower back, initial encounter: Secondary | ICD-10-CM | POA: Diagnosis present

## 2021-12-29 DIAGNOSIS — M62830 Muscle spasm of back: Secondary | ICD-10-CM | POA: Insufficient documentation

## 2021-12-29 DIAGNOSIS — X500XXA Overexertion from strenuous movement or load, initial encounter: Secondary | ICD-10-CM | POA: Insufficient documentation

## 2021-12-29 MED ORDER — NAPROXEN 500 MG PO TABS: 500.0000 mg | ORAL_TABLET | Freq: Two times a day (BID) | ORAL | 0 refills | Status: DC

## 2021-12-29 MED ORDER — LIDOCAINE 5 % EX PTCH: 1.0000 | MEDICATED_PATCH | CUTANEOUS | 0 refills | Status: DC

## 2021-12-29 MED ORDER — CYCLOBENZAPRINE HCL 10 MG PO TABS: 10.0000 mg | ORAL_TABLET | Freq: Two times a day (BID) | ORAL | 0 refills | Status: DC | PRN

## 2021-12-29 MED ORDER — KETOROLAC TROMETHAMINE 15 MG/ML IJ SOLN
30.0000 mg | Freq: Once | INTRAMUSCULAR | Status: AC
  Administered 2021-12-29: 30 mg via INTRAMUSCULAR
  Filled 2021-12-29: qty 2

## 2021-12-29 MED ORDER — CYCLOBENZAPRINE HCL 10 MG PO TABS
5.0000 mg | ORAL_TABLET | Freq: Once | ORAL | Status: AC
  Administered 2021-12-29: 5 mg via ORAL
  Filled 2021-12-29: qty 1

## 2021-12-29 NOTE — ED Triage Notes (Signed)
Pt states she is having lower back pain that feels like a spasm. States this pain radiates to her right hip area. Pt denies injury but states she lifts bags and boxes at work.

## 2021-12-29 NOTE — ED Notes (Signed)
Pt ambulatory to waiting room. Pt verbalized understanding of discharge instructions.   

## 2021-12-29 NOTE — Discharge Instructions (Addendum)
You were evaluated in the Emergency Department and after careful evaluation, we did not find any emergent condition requiring admission or further testing in the hospital.  Your exam/testing today was overall reassuring. Low suspicion for kidney stone at this time. Recommend NSAIDs and Flexeril for muscle spasm in the setting of a low back muscle strain.  Please return to the Emergency Department if you experience any worsening of your condition.  Thank you for allowing [place] to be a part of your care.

## 2021-12-29 NOTE — ED Provider Notes (Signed)
[place] DEPT Provider Note   CSN: [id number] Arrival date & time: 12/29/21  1038     History  Chief Complaint  Patient presents with   Back Pain    Debra Vargas is a 51 y.o. female.   Back Pain  51 year old female who presents to the emergency department with a chief complaint of back pain.  She states that the pain feels like a muscle spasm.  She denies any injury to her lower back.  She does do some heavy lifting with bags and boxes at work.  She denies any radicular pain.  She denies any perineal numbness.  She denies any lower extremity weakness.  She denies any bowel or bladder incontinence.  She denies any fevers or chills.  The pain has been bothering her for the past few days.  Home Medications Prior to Admission medications   Medication Sig Start Date End Date Taking? Authorizing Provider  cyclobenzaprine (FLEXERIL) 10 MG tablet Take 1 tablet (10 mg total) by mouth 2 (two) times daily as needed for muscle spasms. 12/29/21  Yes Regan Lemming, MD  lidocaine (LIDODERM) 5 % Place 1 patch onto the skin daily. Remove & Discard patch within 12 hours or as directed by MD 12/29/21  Yes Regan Lemming, MD  naproxen (NAPROSYN) 500 MG tablet Take 1 tablet (500 mg total) by mouth 2 (two) times daily. 12/29/21  Yes Regan Lemming, MD  naproxen (NAPROSYN) 500 MG tablet Take 1 tablet (500 mg total) by mouth 2 (two) times daily. 12/29/21   Regan Lemming, MD      Allergies    Patient has no known allergies.    Review of Systems   Review of Systems  Musculoskeletal:  Positive for back pain.  All other systems reviewed and are negative.  Physical Exam Updated Vital Signs BP (!) 185/103 (BP Location: Left Arm)    Pulse 93    Temp 98 F (36.7 C) (Oral)    Resp 18    Ht 5\' 1"  (1.549 m)    Wt 93 kg    SpO2 97%    BMI 38.73 kg/m  Physical Exam Vitals and nursing note reviewed.  Constitutional:      General: She is not in acute distress.    Appearance:  She is well-developed.  HENT:     Head: Normocephalic and atraumatic.  Eyes:     Conjunctiva/sclera: Conjunctivae normal.  Cardiovascular:     Rate and Rhythm: Normal rate and regular rhythm.  Pulmonary:     Effort: Pulmonary effort is normal. No respiratory distress.     Breath sounds: Normal breath sounds.  Abdominal:     Palpations: Abdomen is soft.     Tenderness: There is no abdominal tenderness.  Musculoskeletal:        General: No swelling.     Cervical back: Neck supple.     Comments: Negative straight leg raise test bilaterally.  Negative midline tenderness to palpation of the thoracic or lumbar spine.  Mild soft tissue tenderness in the bilateral lumbar spine.  Skin:    General: Skin is warm and dry.     Capillary Refill: Capillary refill takes less than 2 seconds.  Neurological:     Mental Status: She is alert.     Comments: 5 out of 5 strength in the bilateral lower extremities with intact sensation to light touch.  Psychiatric:        Mood and Affect: Mood normal.    ED Results /  Procedures / Treatments   Labs (all labs ordered are listed, but only abnormal results are displayed) Labs Reviewed - No data to display  EKG None  Radiology No results found.  Procedures Procedures    Medications Ordered in ED Medications  ketorolac (TORADOL) 15 MG/ML injection 30 mg (30 mg Intramuscular Given 12/29/21 1118)  cyclobenzaprine (FLEXERIL) tablet 5 mg (5 mg Oral Given 12/29/21 1118)    ED Course/ Medical Decision Making/ A&P                           Medical Decision Making Risk Prescription drug management.   51 year old female who presents to the emergency department with a chief complaint of back pain.  She states that the pain feels like a muscle spasm.  She denies any injury to her lower back.  She does do some heavy lifting with bags and boxes at work.  She denies any radicular pain.  She denies any perineal numbness.  She denies any lower extremity  weakness.  She denies any bowel or bladder incontinence.  She denies any fevers or chills.  The pain has been bothering her for the past few days.  Overall no red flag symptoms for cauda equina syndrome.  Low suspicion for acute fracture given reassuring physical exam and history of present illness.  Symptoms are most consistent with lower lumbar strain.  Patient was provided with Toradol and Flexeril and felt symptomatically improved.   We will treat with lidocaine patches and naproxen in addition to Flexeril.  Stable for discharge.  A work note was provided.     Final Clinical Impression(s) / ED Diagnoses Final diagnoses:  Strain of lumbar region, initial encounter  Muscle spasm    Rx / DC Orders ED Discharge Orders          Ordered    cyclobenzaprine (FLEXERIL) 10 MG tablet  2 times daily PRN        12/29/21 1251    naproxen (NAPROSYN) 500 MG tablet  2 times daily        12/29/21 1251    lidocaine (LIDODERM) 5 %  Every 24 hours        12/29/21 1251    naproxen (NAPROSYN) 500 MG tablet  2 times daily        12/29/21 1251              Regan Lemming, MD 12/29/21 [id number]

## 2022-07-04 ENCOUNTER — Emergency Department (HOSPITAL_COMMUNITY)
Admission: EM | Admit: 2022-07-04 | Discharge: 2022-07-04 | Disposition: A | Discharge status: Home / Self Care | Payer: Federal, State, Local not specified - PPO

## 2023-06-19 ENCOUNTER — Ambulatory Visit (INDEPENDENT_AMBULATORY_CARE_PROVIDER_SITE_OTHER): Payer: Federal, State, Local not specified - PPO | Admitting: Dermatology

## 2023-06-19 ENCOUNTER — Encounter: Payer: Self-pay | Admitting: Dermatology

## 2023-06-19 VITALS — BP 173/112 | HR 97

## 2023-06-19 DIAGNOSIS — D2339 Other benign neoplasm of skin of other parts of face: Secondary | ICD-10-CM

## 2023-06-19 DIAGNOSIS — D229 Melanocytic nevi, unspecified: Secondary | ICD-10-CM

## 2023-06-19 NOTE — Progress Notes (Unsigned)
   New Patient Visit   Subjective  Debra Vargas is a 52 y.o. female who presents for the following: Nevus  Patient states she has a 3 moles located at the face that she would like to have examined. Patient reports the areas have been there for  several  year(s). she reports the areas are not bothersome. She states that the areas have spread. Patient reports has not previously been treated for these areas. Patient denies Hx of bx. Patient denies family history of skin cancer(s).    The following portions of the chart were reviewed this encounter and updated as appropriate: medications, allergies, medical history  Review of Systems:  No other skin or systemic complaints except as noted in HPI or Assessment and Plan.  Objective  Well appearing patient in no apparent distress; mood and affect are within normal limits.  A focused examination was performed of the following areas: Right Cheek, Left Cheek and Nasal Bridge, Right Eye brow   Relevant exam findings are noted in the Assessment and Plan.                Assessment & Plan   Intradermal Nevi Exam: Left Lateral nose 5.5 mm brown papule, 6 mm brown pigmented papule,  4.25 mm dark brown, Right Eyebrow 4.4 mm brown papules  Treatment Plan:   - Benign appearing on exam today  - Recommend observation - Call clinic for new or changing moles - Recommend daily use of broad spectrum spf 30+ sunscreen to sun-exposed areas - Per pts request, we will refer to Plastics to have removed  No follow-ups on file.  Documentation: I have reviewed the above documentation for accuracy and completeness, and I agree with the above.  Stasia Cavalier, am acting as scribe for Langston Reusing, DO.  Langston Reusing, DO

## 2023-06-19 NOTE — Patient Instructions (Addendum)
Hello Miss Carolyne,  Thank you for visiting our dermatology clinic today. We appreciate your commitment to monitoring your skin health and are glad you chose to consult with [place] regarding your moles.  Here is a summary of the key points from our consultation:  - Observation and Diagnosis: Your moles, identified as intradermal nevi, are normal and non-cancerous. They are elevated, dome-shaped, and have deep pigmentation extending from the epidermis into the dermis.  - Photographic Documentation: We have taken photographs and measurements of the moles for our records:   - Left lateral nose: 5.5 mm   - Dorsal nose: 6 mm   -Right malar cheek: 4.6mm   - Right eyebrow: 4.5 mm  - Annual Follow-up: I recommend scheduling an annual visit to monitor any changes in the moles over time.  - Referral to Plastic Surgery: If you decide on cosmetic removal, we have referred you to Dr. Ulice Bold and Dr. Ladona Ridgel for a consultation. They will discuss potential techniques, healing processes, and costs. Cosmetic removal may result in scars, and insurance coverage for this procedure is not guaranteed.  - Cost Estimates: The estimated cost for removal is between $800 to $1,200 per mole, depending on the technique used by the plastic surgeon (This is subject to change per the protocol of the office)  - Educational Materials: We have provided you with a bag containing various moisturizers, sunscreens, and washes to assist in your skin care routine.  Please feel free to reach out via MyChart if you have any further questions or need additional information.  Wishing you a fabulous day and looking forward to seeing you at your next appointment.  Warm regards,  Dr. Langston Reusing, Dermatologist    Due to recent changes in healthcare laws, you may see results of your pathology and/or laboratory studies on MyChart before the doctors have had a chance to review them. We understand that in some cases there may be results  that are confusing or concerning to you. Please understand that not all results are received at the same time and often the doctors may need to interpret multiple results in order to provide you with the best plan of care or course of treatment. Therefore, we ask that you please give [place] 2 business days to thoroughly review all your results before contacting the office for clarification. Should we see a critical lab result, you will be contacted sooner.   If You Need Anything After Your Visit  If you have any questions or concerns for your doctor, please call our main line at [phone] If no one answers, please leave a voicemail as directed and we will return your call as soon as possible. Messages left after 4 pm will be answered the following business day.   You may also send [place] a message via MyChart. We typically respond to MyChart messages within 1-2 business days.  For prescription refills, please ask your pharmacy to contact our office. Our fax number is [phone].  If you have an urgent issue when the clinic is closed that cannot wait until the next business day, you can page your doctor at the number below.    Please note that while we do our best to be available for urgent issues outside of office hours, we are not available 24/7.   If you have an urgent issue and are unable to reach [place], you may choose to seek medical care at your doctor's office, retail clinic, urgent care center, or emergency room.  If you  have a medical emergency, please immediately call 911 or go to the emergency department. In the event of inclement weather, please call our main line at [phone] for an update on the status of any delays or closures.  Dermatology Medication Tips: Please keep the boxes that topical medications come in in order to help keep track of the instructions about where and how to use these. Pharmacies typically print the medication instructions only on the boxes and not directly on  the medication tubes.   If your medication is too expensive, please contact our office at [phone] or send [place] a message through MyChart.   We are unable to tell what your co-pay for medications will be in advance as this is different depending on your insurance coverage. However, we may be able to find a substitute medication at lower cost or fill out paperwork to get insurance to cover a needed medication.   If a prior authorization is required to get your medication covered by your insurance company, please allow [place] 1-2 business days to complete this process.  Drug prices often vary depending on where the prescription is filled and some pharmacies may offer cheaper prices.  The website www.goodrx.com contains coupons for medications through different pharmacies. The prices here do not account for what the cost may be with help from insurance (it may be cheaper with your insurance), but the website can give you the price if you did not use any insurance.  - You can print the associated coupon and take it with your prescription to the pharmacy.  - You may also stop by our office during regular business hours and pick up a GoodRx coupon card.  - If you need your prescription sent electronically to a different pharmacy, notify our office through [facility] or by phone at [phone]

## 2023-09-04 DIAGNOSIS — J3089 Other allergic rhinitis: Secondary | ICD-10-CM | POA: Insufficient documentation

## 2023-09-04 DIAGNOSIS — S29019A Strain of muscle and tendon of unspecified wall of thorax, initial encounter: Secondary | ICD-10-CM | POA: Insufficient documentation

## 2023-10-16 ENCOUNTER — Ambulatory Visit: Payer: Federal, State, Local not specified - PPO | Admitting: Podiatry

## 2023-11-07 ENCOUNTER — Ambulatory Visit (INDEPENDENT_AMBULATORY_CARE_PROVIDER_SITE_OTHER): Payer: Federal, State, Local not specified - PPO

## 2023-11-07 ENCOUNTER — Ambulatory Visit (INDEPENDENT_AMBULATORY_CARE_PROVIDER_SITE_OTHER): Payer: Federal, State, Local not specified - PPO | Admitting: Podiatry

## 2023-11-07 ENCOUNTER — Encounter: Payer: Self-pay | Admitting: Podiatry

## 2023-11-07 DIAGNOSIS — M722 Plantar fascial fibromatosis: Secondary | ICD-10-CM | POA: Diagnosis not present

## 2023-11-07 MED ORDER — METHYLPREDNISOLONE 4 MG PO TBPK: ORAL_TABLET | ORAL | 0 refills | Status: DC

## 2023-11-07 MED ORDER — MELOXICAM 15 MG PO TABS: 15.0000 mg | ORAL_TABLET | Freq: Every day | ORAL | 3 refills | Status: DC

## 2023-11-07 NOTE — Progress Notes (Signed)
  Subjective:  Patient ID: Debra Vargas, female    DOB: [birth year],  MRN: [id number] HPI Chief Complaint  Patient presents with   Foot Pain    She reports she has had foot pain for a long period, she feels like she has a pebble in her shoe when walking. She has tried medication and ice with no relief.     52 y.o. female presents with the above complaint.   ROS: Denies fever chills nausea vomit muscle aches pains calf pain back pain chest pain shortness of breath.  Past Medical History:  Diagnosis Date   Blood transfusion without reported diagnosis 1992   after D&C for SAB   Hypertension    Past Surgical History:  Procedure Laterality Date   DILATION AND CURETTAGE OF UTERUS  1992   NOVASURE ABLATION  2014    Current Outpatient Medications:    cloNIDine (CATAPRES) 0.1 MG tablet, Take by mouth., Disp: , Rfl:    lisinopril (ZESTRIL) 10 MG tablet, Take 10 mg by mouth daily., Disp: , Rfl:    meloxicam  (MOBIC ) 15 MG tablet, Take 1 tablet (15 mg total) by mouth daily., Disp: 30 tablet, Rfl: 3   methylPREDNISolone  (MEDROL  DOSEPAK) 4 MG TBPK tablet, 6 day dose pack - take as directed, Disp: 21 tablet, Rfl: 0  No Known Allergies Review of Systems Objective:  There were no vitals filed for this visit.  General: Well developed, nourished, in no acute distress, alert and oriented x3   Dermatological: Skin is warm, dry and supple bilateral. Nails x 10 are well maintained; remaining integument appears unremarkable at this time. There are no open sores, no preulcerative lesions, no rash or signs of infection present.  Vascular: Dorsalis Pedis artery and Posterior Tibial artery pedal pulses are 2/4 bilateral with immedate capillary fill time. Pedal hair growth present. No varicosities and no lower extremity edema present bilateral.   Neruologic: Grossly intact via light touch bilateral. Vibratory intact via tuning fork bilateral. Protective threshold with Semmes Wienstein monofilament intact  to all pedal sites bilateral. Patellar and Achilles deep tendon reflexes 2+ bilateral. No Babinski or clonus noted bilateral.   Musculoskeletal: No gross boney pedal deformities bilateral. No pain, crepitus, or limitation noted with foot and ankle range of motion bilateral. Muscular strength 5/5 in all groups tested bilateral.  Pain on palpation medial calcaneal tubercle right foot.  No pain on medial-lateral compression of the calcaneus no pain with palpation of the Achilles tendon.  Gait: Unassisted, Nonantalgic.    Radiographs:  Radiographs taken today demonstrate osseously mature individual with good bone mineralization right foot lateral view demonstrates soft tissue increase in density plantar fascial kidney insertion site multiple plantar calcaneal heel spurs and 1 posterior heel spur.  Soft tissue margins of the Achilles appear to be normal.  Assessment & Plan:   Assessment: Plantar fasciitis right  Plan: Discussed etiology pathology conservative versus surgical therapies.  Both oral and written home-going instructions were provided.  Injected the right heel today 20 mg Kenalog  5 mg Marcaine point maximal tenderness.  Tolerated procedure well without complications.  Dispensed plantar fascial brace and a night splint.  Started her on methylprednisolone  to be followed by meloxicam .  Will follow-up with her in 4 to 6 weeks.     Debra Vargas T. [place], [place]

## 2023-11-09 ENCOUNTER — Encounter: Payer: Self-pay | Admitting: Podiatry

## 2023-11-22 ENCOUNTER — Telehealth: Payer: Self-pay

## 2023-12-19 ENCOUNTER — Ambulatory Visit: Payer: Federal, State, Local not specified - PPO | Admitting: Podiatry

## 2024-03-30 ENCOUNTER — Other Ambulatory Visit: Payer: Self-pay | Admitting: Podiatry

## 2024-04-04 ENCOUNTER — Encounter: Payer: Self-pay | Admitting: Podiatry

## 2024-04-04 ENCOUNTER — Ambulatory Visit (INDEPENDENT_AMBULATORY_CARE_PROVIDER_SITE_OTHER): Admitting: Podiatry

## 2024-04-04 DIAGNOSIS — M722 Plantar fascial fibromatosis: Secondary | ICD-10-CM | POA: Diagnosis not present

## 2024-04-04 DIAGNOSIS — Z0271 Encounter for disability determination: Secondary | ICD-10-CM

## 2024-04-04 MED ORDER — TRIAMCINOLONE ACETONIDE 40 MG/ML IJ SUSP
20.0000 mg | Freq: Once | INTRAMUSCULAR | Status: AC
  Administered 2024-04-04: 20 mg

## 2024-04-04 NOTE — Telephone Encounter (Signed)
 S/w pt and advise would fax [facility] form/note [phone] for intermittent fmla 03/07/24-11/06/24.Leaving a copy for her to pick up from office when she can.

## 2024-04-04 NOTE — Progress Notes (Signed)
 She presents today after having not seen her since December of last year.  She is complaining of plantar fasciitis pain more on the right than the left.  States that is affecting her ability to work well and hurts at work mostly.  States that she was [place] the beach this [place] weekend and felt a pop in her heel as she was going to her car in the garage.  She states that it has increased in pain since that time.  Objective: Vitals are stable alert oriented x 3.  There is no erythema edema salines drainage or odor she has pain on palpation medial calcaneal tubercle of the right heel as opposed to the left heel.  Assessment: Plantar fasciitis right possible plantar fascial tear right.  Plan: At this point we injected the right heel 20 mg Kenalog milligrams Marcaine follow-up with her in 1 month at which time may need to consider MRI.  She submitted FMLA paperwork for intermittent work status.

## 2024-04-15 ENCOUNTER — Encounter

## 2024-04-30 ENCOUNTER — Ambulatory Visit: Admitting: Podiatry

## 2024-05-22 ENCOUNTER — Encounter (HOSPITAL_BASED_OUTPATIENT_CLINIC_OR_DEPARTMENT_OTHER): Payer: Self-pay | Admitting: Emergency Medicine

## 2024-05-22 ENCOUNTER — Emergency Department (HOSPITAL_BASED_OUTPATIENT_CLINIC_OR_DEPARTMENT_OTHER)
Admission: EM | Admit: 2024-05-22 | Discharge: 2024-05-22 | Disposition: A | Discharge status: Home / Self Care | Attending: Emergency Medicine | Admitting: Emergency Medicine

## 2024-05-22 ENCOUNTER — Emergency Department (HOSPITAL_BASED_OUTPATIENT_CLINIC_OR_DEPARTMENT_OTHER)

## 2024-05-22 ENCOUNTER — Other Ambulatory Visit: Payer: Self-pay

## 2024-05-22 DIAGNOSIS — I10 Essential (primary) hypertension: Secondary | ICD-10-CM | POA: Diagnosis not present

## 2024-05-22 DIAGNOSIS — N201 Calculus of ureter: Secondary | ICD-10-CM

## 2024-05-22 DIAGNOSIS — Z79899 Other long term (current) drug therapy: Secondary | ICD-10-CM | POA: Diagnosis not present

## 2024-05-22 DIAGNOSIS — N132 Hydronephrosis with renal and ureteral calculous obstruction: Secondary | ICD-10-CM | POA: Diagnosis not present

## 2024-05-22 DIAGNOSIS — R109 Unspecified abdominal pain: Secondary | ICD-10-CM | POA: Diagnosis present

## 2024-05-22 LAB — COMPREHENSIVE METABOLIC PANEL WITH GFR
ALT: 23 U/L (ref 0–44)
AST: 23 U/L (ref 15–41)
Albumin: 4.2 g/dL (ref 3.5–5.0)
Alkaline Phosphatase: 62 U/L (ref 38–126)
Anion gap: 14 (ref 5–15)
BUN: 14 mg/dL (ref 6–20)
CO2: 25 mmol/L (ref 22–32)
Calcium: 11.3 mg/dL — ABNORMAL HIGH (ref 8.9–10.3)
Chloride: 103 mmol/L (ref 98–111)
Creatinine, Ser: 0.74 mg/dL (ref 0.44–1.00)
GFR, Estimated: >60 mL/min (ref >60)
Glucose, Bld: 122 mg/dL — ABNORMAL HIGH (ref 70–99)
Potassium: 3.2 mmol/L — ABNORMAL LOW (ref 3.5–5.1)
Sodium: 142 mmol/L (ref 135–145)
Total Bilirubin: 0.2 mg/dL (ref 0.0–1.2)
Total Protein: 7.4 g/dL (ref 6.5–8.1)

## 2024-05-22 LAB — URINALYSIS, ROUTINE W REFLEX MICROSCOPIC
Bacteria, UA: NONE SEEN
Bilirubin Urine: NEGATIVE
Glucose, UA: NEGATIVE mg/dL
Ketones, ur: NEGATIVE mg/dL
Leukocytes,Ua: NEGATIVE
Nitrite: NEGATIVE
Protein, ur: NEGATIVE mg/dL
RBC / HPF: >50 RBC/hpf (ref 0–5)
Specific Gravity, Urine: 1.014 (ref 1.005–1.030)
pH: 6.5 (ref 5.0–8.0)

## 2024-05-22 LAB — CBC
HCT: 42.5 % (ref 36.0–46.0)
Hemoglobin: 14.3 g/dL (ref 12.0–15.0)
MCH: 29.2 pg (ref 26.0–34.0)
MCHC: 33.6 g/dL (ref 30.0–36.0)
MCV: 86.7 fL (ref 80.0–100.0)
Platelets: 374 K/uL (ref 150–400)
RBC: 4.9 MIL/uL (ref 3.87–5.11)
RDW: 13 % (ref 11.5–15.5)
WBC: 14.7 K/uL — ABNORMAL HIGH (ref 4.0–10.5)
nRBC: 0 % (ref 0.0–0.2)

## 2024-05-22 LAB — LIPASE, BLOOD: Lipase: 23 U/L (ref 11–51)

## 2024-05-22 MED ORDER — MORPHINE SULFATE (PF) 4 MG/ML IV SOLN
4.0000 mg | Freq: Once | INTRAVENOUS | Status: AC
  Administered 2024-05-22: 4 mg via INTRAVENOUS
  Filled 2024-05-22: qty 1

## 2024-05-22 MED ORDER — TAMSULOSIN HCL 0.4 MG PO CAPS: 0.4000 mg | ORAL_CAPSULE | Freq: Every day | ORAL | 0 refills | Status: AC

## 2024-05-22 MED ORDER — SODIUM CHLORIDE 0.9 % IV BOLUS
1000.0000 mL | Freq: Once | INTRAVENOUS | Status: AC
  Administered 2024-05-22: 1000 mL via INTRAVENOUS

## 2024-05-22 MED ORDER — HYDROCODONE-ACETAMINOPHEN 5-325 MG PO TABS
1.0000 | ORAL_TABLET | Freq: Four times a day (QID) | ORAL | 0 refills | Status: DC | PRN
  Filled 2024-05-22: qty 6, 2d supply, fill #0

## 2024-05-22 MED ORDER — HYDROCODONE-ACETAMINOPHEN 5-325 MG PO TABS: 1.0000 | ORAL_TABLET | Freq: Four times a day (QID) | ORAL | 0 refills | Status: DC | PRN

## 2024-05-22 MED ORDER — TAMSULOSIN HCL 0.4 MG PO CAPS
0.4000 mg | ORAL_CAPSULE | Freq: Every day | ORAL | 0 refills | Status: DC
  Filled 2024-05-22: qty 30, 30d supply, fill #0

## 2024-05-22 MED ORDER — ONDANSETRON HCL 4 MG/2ML IJ SOLN
4.0000 mg | Freq: Once | INTRAMUSCULAR | Status: AC
  Administered 2024-05-22: 4 mg via INTRAVENOUS
  Filled 2024-05-22: qty 2

## 2024-05-22 MED ORDER — KETOROLAC TROMETHAMINE 30 MG/ML IJ SOLN
15.0000 mg | Freq: Once | INTRAMUSCULAR | Status: AC
  Administered 2024-05-22: 15 mg via INTRAVENOUS
  Filled 2024-05-22: qty 1

## 2024-05-22 NOTE — Discharge Instructions (Signed)
 Evaluation revealed that you have a small right-sided kidney stone.  This is likely causing your pain.  I sent a few tablets of Norco to your pharmacy for pain and Flomax  to help promote passing of that stone.  If you develop a fever, nausea vomiting, worsening abdominal pain, painful or malodorous urination or any other concerning symptom please return to the ED for further evaluation.  Otherwise recommend that you follow-up with urology.

## 2024-05-22 NOTE — ED Triage Notes (Signed)
 Pt via pov from home with right sided abdominal pain that began this morning. She denies emesis, endorses nausea. Denies urinary symptoms; states she had some vaginal spotting yesterday. Pt a&o x 4; nad noted.

## 2024-05-22 NOTE — ED Provider Notes (Signed)
 [facility] AT [facility] Provider Note   CSN: [id number] Arrival date & time: 05/22/24  1356     Patient presents with: Abdominal Pain  HPI Debra Vargas is a 53 y.o. female with history of hypertension, irregular uterine bleeding, uterine fibroid presenting for abdominal pain.  She states it started this morning.  States its mostly in the right flank but does radiate to the mid abdomen.  Endorses some nausea but no vomiting or diarrhea.  Having normal bowel movement yesterday.  States she noted some light vaginal spotting yesterday as well.  Denies chest pain shortness of breath.    Abdominal Pain      Prior to Admission medications   Medication Sig Start Date End Date Taking? Authorizing Provider  HYDROcodone -acetaminophen  (NORCO/VICODIN) 5-325 MG tablet Take 1 tablet by mouth every 6 (six) hours as needed. 05/22/24  Yes Lang Norleen POUR, PA-C  tamsulosin  (FLOMAX ) 0.4 MG CAPS capsule Take 1 capsule (0.4 mg total) by mouth daily. 05/22/24  Yes Cailen Mihalik K, PA-C  amLODipine (NORVASC) 10 MG tablet Take 10 mg by mouth. 02/14/24   [provider]  chlorthalidone (HYGROTON) 25 MG tablet Take 25 mg by mouth. 02/14/24   [provider]  lisinopril (ZESTRIL) 10 MG tablet Take 10 mg by mouth daily.    [provider]  meloxicam  (MOBIC ) 15 MG tablet Take 1 tablet by mouth once daily 04/02/24   Hyatt, Max T, DPM  montelukast (SINGULAIR) 10 MG tablet Take 10 mg by mouth daily. 01/30/24   [provider]    Allergies: Patient has no known allergies.    Review of Systems  Gastrointestinal:  Positive for abdominal pain.    Updated Vital Signs BP (!) 147/88   Pulse 94   Temp 98.3 F (36.8 C)   Resp 18   Ht 5' 1 (1.549 m)   Wt 99.3 kg   SpO2 95%   BMI 41.38 kg/m   Physical Exam Vitals and nursing note reviewed.  HENT:     Head: Normocephalic and atraumatic.     Mouth/Throat:     Mouth: Mucous membranes are moist.   Eyes:     General:        Right eye: No discharge.        Left eye: No discharge.     Conjunctiva/sclera: Conjunctivae normal.  Cardiovascular:     Rate and Rhythm: Normal rate and regular rhythm.     Pulses: Normal pulses.     Heart sounds: Normal heart sounds.  Pulmonary:     Effort: Pulmonary effort is normal.     Breath sounds: Normal breath sounds.  Abdominal:     General: Abdomen is flat.     Palpations: Abdomen is soft.     Tenderness: There is no abdominal tenderness.  Skin:    General: Skin is warm and dry.  Neurological:     General: No focal deficit present.  Psychiatric:        Mood and Affect: Mood normal.     (all labs ordered are listed, but only abnormal results are displayed) Labs Reviewed  COMPREHENSIVE METABOLIC PANEL WITH GFR - Abnormal; Notable for the following components:      Result Value   Potassium 3.2 (*)    Glucose, Bld 122 (*)    Calcium 11.3 (*)    All other components within normal limits  CBC - Abnormal; Notable for the following components:   WBC 14.7 (*)  All other components within normal limits  URINALYSIS, ROUTINE W REFLEX MICROSCOPIC - Abnormal; Notable for the following components:   Hgb urine dipstick LARGE (*)    All other components within normal limits  LIPASE, BLOOD    EKG: None  Radiology: CT Renal Stone Study Result Date: 05/22/2024 CLINICAL DATA:  Right-sided abdominal pain, stone suspected EXAM: CT ABDOMEN AND PELVIS WITHOUT CONTRAST TECHNIQUE: Multidetector CT imaging of the abdomen and pelvis was performed following the standard protocol without IV contrast. RADIATION DOSE REDUCTION: This exam was performed according to the departmental dose-optimization program which includes automated exposure control, adjustment of the mA and/or kV according to patient size and/or use of iterative reconstruction technique. COMPARISON:  None Available. FINDINGS: Lower chest: No acute abnormality. Hepatobiliary: No solid liver  abnormality is seen. No gallstones, gallbladder wall thickening, or biliary dilatation. Pancreas: Unremarkable. No pancreatic ductal dilatation or surrounding inflammatory changes. Spleen: Normal in size without significant abnormality. Adrenals/Urinary Tract: Adrenal glands are unremarkable. Punctuate calculus at the right ureterovesicular junction measuring no greater than 0.2 cm (series 3, image 70). Moderate associated right hydronephrosis and hydroureter. Multiple additional punctuate nonobstructive calculi bilaterally, more numerous on the left and particularly in the inferior pole of the left kidney. No left-sided hydronephrosis. Bladder is unremarkable. Stomach/Bowel: Stomach is within normal limits. Appendix appears normal. No evidence of bowel wall thickening, distention, or inflammatory changes. Vascular/Lymphatic: No significant vascular findings are present. No enlarged abdominal or pelvic lymph nodes. Reproductive: No mass or other significant abnormality. Other: No abdominal wall hernia or abnormality. No ascites. Musculoskeletal: No acute or significant osseous findings. IMPRESSION: 1. Punctuate calculus at the right ureterovesicular junction measuring no greater than 0.2 cm. Moderate associated right hydronephrosis and hydroureter. 2. Multiple additional punctuate nonobstructive calculi bilaterally, more numerous on the left and particularly in the inferior pole of the left kidney. No left-sided hydronephrosis. Electronically Signed   By: Marolyn JONETTA Jaksch M.D.   On: 05/22/2024 15:56     Procedures   Medications Ordered in the ED  sodium chloride  0.9 % bolus 1,000 mL (has no administration in time range)  ketorolac  (TORADOL ) 30 MG/ML injection 15 mg (has no administration in time range)  ondansetron  (ZOFRAN ) injection 4 mg (4 mg Intravenous Given 05/22/24 1534)  morphine  (PF) 4 MG/ML injection 4 mg (4 mg Intravenous Given 05/22/24 1533)                                    Medical Decision  Making Amount and/or Complexity of Data Reviewed Labs: ordered. Radiology: ordered.  Risk Prescription drug management.   Initial Impression and Ddx 53 yo well-appearing female presenting for abdominal pain.  Exam was unremarkable.  DDx includes kidney stone, pyelonephritis, acute cholecystitis, appendicitis, ovarian torsion, AUB, other. Patient PMH that increases complexity of ED encounter:  hypertension, irregular uterine bleeding, uterine fibroid   Interpretation of Diagnostics - I independent reviewed and interpreted the labs as followed: leukocytosis (14.7)  - I independently visualized the following imaging with scope of interpretation limited to determining acute life threatening conditions related to emergency care: CT renal, which revealed  Punctuate calculus at the right ureterovesicular junction measuring no greater than 0.2 cm. Moderate associated right hydronephrosis and hydroureter. 2. Multiple additional punctuate nonobstructive calculi bilaterally, more numerous on the left and particularly in the inferior pole of the left kidney. No left-sided hydronephrosis  Patient Reassessment and Ultimate Disposition/Management On reassessment pain is well-controlled.  Workup revealing a stone at the right UVJ.  Renal function is preserved per labs.  Does have a leukocytosis but UA not suggestive of infection and she does not have a fever this associated infection is unlikely.  Sent a few tablets of Norco to her pharmacy and Flomax .  Advised to follow-up with urology.  Discussed return precautions.  Discharged good condition.  Patient management required discussion with the following services or consulting groups:  None  Complexity of Problems Addressed Acute complicated illness or Injury  Additional Data Reviewed and Analyzed Further history obtained from: Past medical history and medications listed in the EMR and Prior ED visit notes  Patient Encounter Risk  Assessment Prescriptions      Final diagnoses:  Calculus of ureter    ED Discharge Orders          Ordered    HYDROcodone -acetaminophen  (NORCO/VICODIN) 5-325 MG tablet  Every 6 hours PRN        05/22/24 1800    tamsulosin  (FLOMAX ) 0.4 MG CAPS capsule  Daily        05/22/24 1800               Lang Norleen POUR, PA-C 05/22/24 1801    Patsey Lot, MD 05/23/24 [phone]

## 2024-05-23 ENCOUNTER — Other Ambulatory Visit (HOSPITAL_BASED_OUTPATIENT_CLINIC_OR_DEPARTMENT_OTHER): Payer: Self-pay

## 2024-08-31 ENCOUNTER — Emergency Department (HOSPITAL_BASED_OUTPATIENT_CLINIC_OR_DEPARTMENT_OTHER)
Admission: EM | Admit: 2024-08-31 | Discharge: 2024-08-31 | Disposition: A | Discharge status: Home / Self Care | Attending: Emergency Medicine | Admitting: Emergency Medicine

## 2024-08-31 ENCOUNTER — Encounter (HOSPITAL_BASED_OUTPATIENT_CLINIC_OR_DEPARTMENT_OTHER): Payer: Self-pay

## 2024-08-31 ENCOUNTER — Other Ambulatory Visit: Payer: Self-pay

## 2024-08-31 DIAGNOSIS — I10 Essential (primary) hypertension: Secondary | ICD-10-CM | POA: Insufficient documentation

## 2024-08-31 DIAGNOSIS — R112 Nausea with vomiting, unspecified: Secondary | ICD-10-CM | POA: Insufficient documentation

## 2024-08-31 DIAGNOSIS — R030 Elevated blood-pressure reading, without diagnosis of hypertension: Secondary | ICD-10-CM

## 2024-08-31 DIAGNOSIS — Z79899 Other long term (current) drug therapy: Secondary | ICD-10-CM | POA: Diagnosis not present

## 2024-08-31 DIAGNOSIS — R42 Dizziness and giddiness: Secondary | ICD-10-CM

## 2024-08-31 LAB — URINALYSIS, ROUTINE W REFLEX MICROSCOPIC
Bilirubin Urine: NEGATIVE
Glucose, UA: NEGATIVE mg/dL
Hgb urine dipstick: NEGATIVE
Ketones, ur: NEGATIVE mg/dL
Leukocytes,Ua: NEGATIVE
Nitrite: NEGATIVE
Protein, ur: NEGATIVE mg/dL
Specific Gravity, Urine: 1.015 (ref 1.005–1.030)
pH: 7 (ref 5.0–8.0)

## 2024-08-31 LAB — COMPREHENSIVE METABOLIC PANEL WITH GFR
ALT: 13 U/L (ref 0–44)
AST: 17 U/L (ref 15–41)
Albumin: 4.1 g/dL (ref 3.5–5.0)
Alkaline Phosphatase: 52 U/L (ref 38–126)
Anion gap: 8 (ref 5–15)
BUN: 6 mg/dL (ref 6–20)
CO2: 29 mmol/L (ref 22–32)
Calcium: 10.6 mg/dL — ABNORMAL HIGH (ref 8.9–10.3)
Chloride: 98 mmol/L (ref 98–111)
Creatinine, Ser: 0.65 mg/dL (ref 0.44–1.00)
GFR, Estimated: >60 mL/min (ref >60)
Glucose, Bld: 104 mg/dL — ABNORMAL HIGH (ref 70–99)
Potassium: 3.3 mmol/L — ABNORMAL LOW (ref 3.5–5.1)
Sodium: 135 mmol/L (ref 135–145)
Total Bilirubin: 0.4 mg/dL (ref 0.0–1.2)
Total Protein: 7.2 g/dL (ref 6.5–8.1)

## 2024-08-31 LAB — CBC
HCT: 41.6 % (ref 36.0–46.0)
Hemoglobin: 14.1 g/dL (ref 12.0–15.0)
MCH: 29.1 pg (ref 26.0–34.0)
MCHC: 33.9 g/dL (ref 30.0–36.0)
MCV: 86 fL (ref 80.0–100.0)
Platelets: 318 K/uL (ref 150–400)
RBC: 4.84 MIL/uL (ref 3.87–5.11)
RDW: 13.1 % (ref 11.5–15.5)
WBC: 10 K/uL (ref 4.0–10.5)
nRBC: 0 % (ref 0.0–0.2)

## 2024-08-31 LAB — PREGNANCY, URINE: Preg Test, Ur: NEGATIVE

## 2024-08-31 MED ORDER — MECLIZINE HCL 25 MG PO TABS: 25.0000 mg | ORAL_TABLET | Freq: Three times a day (TID) | ORAL | 0 refills | Status: AC | PRN

## 2024-08-31 MED ORDER — MECLIZINE HCL 25 MG PO TABS
25.0000 mg | ORAL_TABLET | Freq: Once | ORAL | Status: AC
  Administered 2024-08-31: 25 mg via ORAL
  Filled 2024-08-31: qty 1

## 2024-08-31 MED ORDER — ONDANSETRON 4 MG PO TBDP: ORAL_TABLET | ORAL | 0 refills | Status: AC

## 2024-08-31 MED ORDER — ONDANSETRON 4 MG PO TBDP
8.0000 mg | ORAL_TABLET | Freq: Once | ORAL | Status: AC
  Administered 2024-08-31: 8 mg via ORAL
  Filled 2024-08-31: qty 2

## 2024-08-31 MED ORDER — SODIUM CHLORIDE 0.9 % IV BOLUS
500.0000 mL | Freq: Once | INTRAVENOUS | Status: AC
  Administered 2024-08-31: 500 mL via INTRAVENOUS

## 2024-08-31 NOTE — Discharge Instructions (Addendum)
 It was our pleasure to provide your ER care today - we hope that you feel better.  Drink plenty of fluids/stay well hydrated.   For recent symptoms, follow up closely with primary care doctor in the coming week. Also have your blood pressure rechecked then, as it is high today.  Return to ER if worse, new symptoms, new or worsening or severe abdominal pain, persistent vomiting, fevers, chest pain, trouble breathing, severe headache, fainting, or other concern.

## 2024-08-31 NOTE — ED Provider Notes (Signed)
 Care was taken over from Dr. Bernard.  Patient presented with nausea and vomiting.  She reports that she works third shift.  She started having a little bit of dizziness which she describes as lightheadedness about 4 AM this morning.  She ate breakfast at [employer] and was doing okay.  She then went to sleep and when she woke up and tried to stand up she got dizzy and started having vomiting.  She says the dizziness is anytime she sits up or stands up.  It is a little worse if she moves her head from side-to-side.  She feels like the room is moving.  She denies any associated headache.  No numbness or weakness to her extremities.  No speech deficits or vision changes.  She has some horizontal nystagmus present but no vertical or rotational nystagmus.  She was given IV fluids as well as antiemetics and meclizine.  She is overall feeling much better.  The dizziness has improved.  She is able to ambulate without ataxia.  Doubt posterior circulation stroke.  She was discharged home in good condition.  She was encouraged to have close follow-up with her PCP.  Her blood pressure will need to be rechecked as well.  She was given prescriptions for meclizine and Zofran .  Return precautions were given.   Lenor Hollering, MD 08/31/24 [phone]

## 2024-08-31 NOTE — ED Triage Notes (Signed)
 She states she felt dome dizziness which began at ~ 0500 today and persists. She describes it as being Not too bad. She also c/o increasing nausea throughout today and this afternoon has vomited a couple of times. She denies fever/cough, nor any other sign of current illness.

## 2024-08-31 NOTE — ED Provider Notes (Addendum)
 [facility] [facility] AT [facility] Provider Note   CSN: [id number] Arrival date & time: 08/31/24  1417     Patient presents with: Emesis   Debra Vargas is a 53 y.o. female.   Pt with c/o episodes nausea/vomiting earlier today. Emesis c/w recently ingested food/fluids, no bloody or bilious emesis. Denies abd pain or distension. No diarrhea or constipation. No melena or rectal bleeding. No dysuria, hematuria. No vaginal discharge or  bleeding. No fever or chills. No definite known bad food ingestion ('ate at [employer]'), no known ill contacts.  Denies headache. No chest pain or discomfort. No sob or unusual doe. After nv, did feel mildly lightheaded by that has passed. No current faintness or dizziness.    The history is provided by the patient and medical records.  Emesis Associated symptoms: no abdominal pain, no chills, no cough, no diarrhea, no fever, no headaches and no sore throat        Prior to Admission medications   Medication Sig Start Date End Date Taking? Authorizing Provider  amLODipine (NORVASC) 10 MG tablet Take 10 mg by mouth. 02/14/24   [provider]  chlorthalidone (HYGROTON) 25 MG tablet Take 25 mg by mouth. 02/14/24   [provider]  HYDROcodone -acetaminophen  (NORCO/VICODIN) 5-325 MG tablet Take 1 tablet by mouth every 6 (six) hours as needed. 05/22/24   Robinson, John K, PA-C  lisinopril (ZESTRIL) 10 MG tablet Take 10 mg by mouth daily.    [provider]  meloxicam  (MOBIC ) 15 MG tablet Take 1 tablet by mouth once daily 04/02/24   Hyatt, Max T, DPM  montelukast (SINGULAIR) 10 MG tablet Take 10 mg by mouth daily. 01/30/24   [provider]  tamsulosin  (FLOMAX ) 0.4 MG CAPS capsule Take 1 capsule (0.4 mg total) by mouth daily. 05/22/24   Robinson, John K, PA-C    Allergies: Patient has no known allergies.    Review of Systems  Constitutional:  Negative for chills and fever.  HENT:  Negative for sore throat.    Eyes:  Negative for visual disturbance.  Respiratory:  Negative for cough and shortness of breath.   Cardiovascular:  Negative for chest pain, palpitations and leg swelling.  Gastrointestinal:  Positive for nausea and vomiting. Negative for abdominal pain, constipation and diarrhea.  Genitourinary:  Negative for dysuria, flank pain, vaginal bleeding and vaginal discharge.  Musculoskeletal:  Negative for back pain and neck pain.  Neurological:  Negative for syncope, speech difficulty, weakness, numbness and headaches.    Updated Vital Signs BP (!) 148/90   Pulse 88   Temp 98.2 F (36.8 C) (Oral)   Resp 18   LMP  (LMP Unknown)   SpO2 95%   Physical Exam Vitals and nursing note reviewed.  Constitutional:      Appearance: Normal appearance. She is well-developed.  HENT:     Head: Atraumatic.     Nose: Nose normal.     Mouth/Throat:     Mouth: Mucous membranes are moist.  Eyes:     General: No scleral icterus.    Conjunctiva/sclera: Conjunctivae normal.     Pupils: Pupils are equal, round, and reactive to light.  Neck:     Vascular: No carotid bruit.     Trachea: No tracheal deviation.  Cardiovascular:     Rate and Rhythm: Normal rate and regular rhythm.     Pulses: Normal pulses.     Heart sounds: Normal heart sounds. No murmur heard.    No friction  rub. No gallop.  Pulmonary:     Effort: Pulmonary effort is normal. No respiratory distress.     Breath sounds: Normal breath sounds.  Abdominal:     General: Bowel sounds are normal. There is no distension.     Palpations: Abdomen is soft. There is no mass.     Tenderness: There is no abdominal tenderness. There is no guarding.  Genitourinary:    Comments: No cva tenderness.  Musculoskeletal:        General: No swelling or tenderness.     Cervical back: Normal range of motion and neck supple. No rigidity or tenderness. No muscular tenderness.     Right lower leg: No edema.     Left lower leg: No edema.  Skin:     General: Skin is warm and dry.     Findings: No rash.  Neurological:     Mental Status: She is alert.     Comments: Alert, speech normal. No dysarthria or aphasia. Motor/sens fxn grossly intact bil, stre 5/5. Sens intact. Steady gait.   Psychiatric:        Mood and Affect: Mood normal.     (all labs ordered are listed, but only abnormal results are displayed) Labs Reviewed  CBC  COMPREHENSIVE METABOLIC PANEL WITH GFR  URINALYSIS, ROUTINE W REFLEX MICROSCOPIC  PREGNANCY, URINE    EKG: EKG Interpretation Date/Time:  Saturday August 31 2024 14:27:30 EDT Ventricular Rate:  84 PR Interval:  161 QRS Duration:  99 QT Interval:  403 QTC Calculation: 477 R Axis:   98  Text Interpretation: Sinus rhythm No significant change since last tracing Confirmed by Bernard Drivers ([id number]) on 08/31/2024 2:34:08 PM  Radiology: No results found.   Procedures   Medications Ordered in the ED  ondansetron  (ZOFRAN -ODT) disintegrating tablet 8 mg (8 mg Oral Given 08/31/24 1453)                                    Medical Decision Making Problems Addressed: Elevated blood pressure reading: acute illness or injury Essential hypertension: chronic illness or injury with exacerbation, progression, or side effects of treatment that poses a threat to life or bodily functions Nausea and vomiting in adult: acute illness or injury with systemic symptoms that poses a threat to life or bodily functions  Amount and/or Complexity of Data Reviewed External Data Reviewed: notes. Labs: ordered. Decision-making details documented in ED Course.  Risk Prescription drug management. Decision regarding hospitalization.   Iv ns. Continuous pulse ox and cardiac monitoring. Labs ordered/sent.   Differential diagnosis includes gastroenteritis, food poisoning, etc. Dispo decision including potential need for admission considered - will get labs and reassess.   Reviewed nursing notes and prior charts for  additional history. External reports reviewed.   Zofran  po. Po fluids.   Cardiac monitor: sinus rhythm, rate 80.  Labs reviewed/interpreted by me - pnd.   Bp improved from initial. No pain. No headache. No cp. No abd pain or tenderness.   1514, labs pending. Signed out to Dr Lenor to check pending labs, recheck patient, and dispo appropriately.         Final diagnoses:  None    ED Discharge Orders     None           Bernard Drivers, MD 08/31/24 1514

## 2024-10-09 ENCOUNTER — Emergency Department (HOSPITAL_BASED_OUTPATIENT_CLINIC_OR_DEPARTMENT_OTHER)

## 2024-10-09 ENCOUNTER — Other Ambulatory Visit: Payer: Self-pay

## 2024-10-09 ENCOUNTER — Emergency Department (HOSPITAL_BASED_OUTPATIENT_CLINIC_OR_DEPARTMENT_OTHER)
Admission: EM | Admit: 2024-10-09 | Discharge: 2024-10-09 | Disposition: A | Discharge status: Home / Self Care | Attending: Emergency Medicine | Admitting: Emergency Medicine

## 2024-10-09 DIAGNOSIS — M545 Low back pain, unspecified: Secondary | ICD-10-CM | POA: Diagnosis present

## 2024-10-09 DIAGNOSIS — Z79899 Other long term (current) drug therapy: Secondary | ICD-10-CM | POA: Diagnosis not present

## 2024-10-09 DIAGNOSIS — E876 Hypokalemia: Secondary | ICD-10-CM | POA: Insufficient documentation

## 2024-10-09 DIAGNOSIS — R109 Unspecified abdominal pain: Secondary | ICD-10-CM | POA: Diagnosis not present

## 2024-10-09 DIAGNOSIS — R590 Localized enlarged lymph nodes: Secondary | ICD-10-CM | POA: Insufficient documentation

## 2024-10-09 LAB — CBC WITH DIFFERENTIAL/PLATELET
Abs Immature Granulocytes: 0.02 K/uL (ref 0.00–0.07)
Basophils Absolute: 0 K/uL (ref 0.0–0.1)
Basophils Relative: 0 %
Eosinophils Absolute: 0.2 K/uL (ref 0.0–0.5)
Eosinophils Relative: 2 %
HCT: 38.2 % (ref 36.0–46.0)
Hemoglobin: 13 g/dL (ref 12.0–15.0)
Immature Granulocytes: 0 %
Lymphocytes Relative: 33 %
Lymphs Abs: 3.2 K/uL (ref 0.7–4.0)
MCH: 29.5 pg (ref 26.0–34.0)
MCHC: 34 g/dL (ref 30.0–36.0)
MCV: 86.6 fL (ref 80.0–100.0)
Monocytes Absolute: 0.4 K/uL (ref 0.1–1.0)
Monocytes Relative: 4 %
Neutro Abs: 5.7 K/uL (ref 1.7–7.7)
Neutrophils Relative %: 61 %
Platelets: 356 K/uL (ref 150–400)
RBC: 4.41 MIL/uL (ref 3.87–5.11)
RDW: 12.6 % (ref 11.5–15.5)
WBC: 9.5 K/uL (ref 4.0–10.5)
nRBC: 0 % (ref 0.0–0.2)

## 2024-10-09 LAB — URINALYSIS, ROUTINE W REFLEX MICROSCOPIC
Bilirubin Urine: NEGATIVE
Glucose, UA: NEGATIVE mg/dL
Ketones, ur: NEGATIVE mg/dL
Leukocytes,Ua: NEGATIVE
Nitrite: NEGATIVE
Specific Gravity, Urine: 1.033 — ABNORMAL HIGH (ref 1.005–1.030)
pH: 6 (ref 5.0–8.0)

## 2024-10-09 LAB — BASIC METABOLIC PANEL WITH GFR
Anion gap: 9 (ref 5–15)
BUN: 13 mg/dL (ref 6–20)
CO2: 30 mmol/L (ref 22–32)
Calcium: 10.2 mg/dL (ref 8.9–10.3)
Chloride: 101 mmol/L (ref 98–111)
Creatinine, Ser: 0.67 mg/dL (ref 0.44–1.00)
GFR, Estimated: >60 mL/min (ref >60)
Glucose, Bld: 106 mg/dL — ABNORMAL HIGH (ref 70–99)
Potassium: 2.9 mmol/L — ABNORMAL LOW (ref 3.5–5.1)
Sodium: 140 mmol/L (ref 135–145)

## 2024-10-09 LAB — MAGNESIUM: Magnesium: 2.1 mg/dL (ref 1.7–2.4)

## 2024-10-09 MED ORDER — CYCLOBENZAPRINE HCL 5 MG PO TABS
5.0000 mg | ORAL_TABLET | Freq: Once | ORAL | Status: AC
  Administered 2024-10-09: 5 mg via ORAL
  Filled 2024-10-09: qty 1

## 2024-10-09 MED ORDER — HYDROCODONE-ACETAMINOPHEN 5-325 MG PO TABS
1.0000 | ORAL_TABLET | Freq: Once | ORAL | Status: AC
  Administered 2024-10-09: 1 via ORAL
  Filled 2024-10-09: qty 1

## 2024-10-09 MED ORDER — CYCLOBENZAPRINE HCL 10 MG PO TABS: 10.0000 mg | ORAL_TABLET | Freq: Two times a day (BID) | ORAL | 0 refills | Status: AC | PRN

## 2024-10-09 MED ORDER — HYDROCODONE-ACETAMINOPHEN 5-325 MG PO TABS: 1.0000 | ORAL_TABLET | Freq: Four times a day (QID) | ORAL | 0 refills | Status: AC | PRN

## 2024-10-09 MED ORDER — POTASSIUM CHLORIDE CRYS ER 20 MEQ PO TBCR
40.0000 meq | EXTENDED_RELEASE_TABLET | Freq: Once | ORAL | Status: AC
  Administered 2024-10-09: 40 meq via ORAL
  Filled 2024-10-09: qty 2

## 2024-10-09 MED ORDER — LIDOCAINE 5 % EX PTCH
1.0000 | MEDICATED_PATCH | CUTANEOUS | Status: DC
  Administered 2024-10-09: 1 via TRANSDERMAL
  Filled 2024-10-09: qty 1

## 2024-10-09 NOTE — ED Provider Notes (Signed)
 Debra Vargas   CSN: [id number] Arrival date & time: 10/09/24  9956     Patient presents with: Back Pain   Debra Vargas is a 53 y.o. female.    Back Pain    52 year old female presenting to the emergency department with flank pain/back pain.  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  She recently got back from a trip and had noticed some flank pain that had resolved.  She denies any injury or trauma to her low back.  She denies any fever or chills.  She denies any history of injection drug use.  She states that she has had pain in the bilateral low back, worse with twisting and movement, no radiation down the legs.  She denies any saddle anesthesia.  She denies any numbness or weakness in the lower extremities.  She denies any dysuria or frequency.  Prior to Admission medications   Medication Sig Start Date End Date Taking? Authorizing Provider  cyclobenzaprine  (FLEXERIL ) 10 MG tablet Take 1 tablet (10 mg total) by mouth 2 (two) times daily as needed for muscle spasms. 10/09/24  Yes Debra Agent, MD  HYDROcodone -acetaminophen  (NORCO/VICODIN) 5-325 MG tablet Take 1-2 tablets by mouth every 6 (six) hours as needed. 10/09/24  Yes Debra Agent, MD  amLODipine (NORVASC) 10 MG tablet Take 10 mg by mouth. 02/14/24   [provider]  chlorthalidone (HYGROTON) 25 MG tablet Take 25 mg by mouth. 02/14/24   [provider]  lisinopril (ZESTRIL) 10 MG tablet Take 10 mg by mouth daily.    [provider]  meclizine  (ANTIVERT ) 25 MG tablet Take 1 tablet (25 mg total) by mouth 3 (three) times daily as needed for dizziness. 08/31/24   Debra Hollering, MD  meloxicam  (MOBIC ) 15 MG tablet Take 1 tablet by mouth once daily 04/02/24   Debra Vargas, Debra Vargas, Debra Vargas  montelukast (SINGULAIR) 10 MG tablet Take 10 mg by mouth daily. 01/30/24   [provider]  ondansetron  (ZOFRAN -ODT) 4 MG disintegrating tablet 4mg  ODT q4  hours prn nausea/vomit 08/31/24   Debra Hollering, MD  tamsulosin  (FLOMAX ) 0.4 MG CAPS capsule Take 1 capsule (0.4 mg total) by mouth daily. 05/22/24   Debra Vargas, Debra Vargas, Debra Vargas    Allergies: Patient has no known allergies.    Review of Systems  Musculoskeletal:  Positive for back pain.  All other systems reviewed and are negative.   Updated Vital Signs BP (!) 148/83   Pulse 86   Temp 97.9 F (36.6 C) (Oral)   Resp 18   Ht 5' 1 (1.549 m)   Wt 97.5 kg   SpO2 99%   BMI 40.62 kg/m   Physical Exam Vitals and nursing Vargas reviewed.  Constitutional:      General: She is not in acute distress.    Appearance: She is well-developed.  HENT:     Head: Normocephalic and atraumatic.  Eyes:     Conjunctiva/sclera: Conjunctivae normal.  Cardiovascular:     Rate and Rhythm: Normal rate and regular rhythm.  Pulmonary:     Effort: Pulmonary effort is normal. No respiratory distress.     Breath sounds: Normal breath sounds.  Abdominal:     Palpations: Abdomen is soft.     Tenderness: There is no abdominal tenderness. There is left CVA tenderness.  Musculoskeletal:        General: No swelling.     Cervical back: Neck supple.     Comments:  Mild bilateral tenderness to palpation of the paraspinal muscles, mild midline tenderness of the lower lumbar spine  Skin:    General: Skin is warm and dry.     Capillary Refill: Capillary refill takes less than 2 seconds.  Neurological:     Mental Status: She is alert.     Comments: 5 out of 5 strength in the bilateral lower extremities with intact sensation to light touch, 5 out of 5 strength in the bilateral upper extremities with intact sensation to light touch  Psychiatric:        Mood and Affect: Mood normal.     (all labs ordered are listed, but only abnormal results are displayed) Labs Reviewed  URINALYSIS, ROUTINE W REFLEX MICROSCOPIC - Abnormal; Notable for the following components:      Result Value   APPearance HAZY (*)    Specific  Gravity, Urine 1.033 (*)    Hgb urine dipstick SMALL (*)    Protein, ur TRACE (*)    Bacteria, UA RARE (*)    All other components within normal limits  BASIC METABOLIC PANEL WITH GFR - Abnormal; Notable for the following components:   Potassium 2.9 (*)    Glucose, Bld 106 (*)    All other components within normal limits  CBC WITH DIFFERENTIAL/PLATELET  MAGNESIUM    EKG: None  Radiology: CT L-SPINE NO CHARGE Result Date: 10/09/2024 EXAM: CT OF THE LUMBAR SPINE WITHOUT CONTRAST 10/09/2024 01:44:45 AM TECHNIQUE: CT of the lumbar spine was performed without the administration of intravenous contrast. Multiplanar reformatted images are provided for review. Automated exposure control, iterative reconstruction, and/or weight based adjustment of the mA/kV was utilized to reduce the radiation dose to as low as reasonably achievable. COMPARISON: None available. CLINICAL HISTORY: FINDINGS: BONES AND ALIGNMENT: Normal vertebral body heights. No acute fracture or suspicious bone lesion. Grade 1 anterolisthesis of L4 is likely chronic. DEGENERATIVE CHANGES: Anterior osteophyte bridging L2 - L3. Mild disc space height loss with vacuum phenomenon at L5 - S1. Intervertebral disc space height is otherwise maintained.Mild facet arthropathy at L4 - L5 and L5 - S1. No severe spinal canal narrowing. SOFT Vargas: No acute abnormality. IMPRESSION: 1. No acute fracture. Electronically signed by: Debra Gatlin MD 10/09/2024 02:05 AM EST RP Workstation: [id number]   CT Renal Stone Study Result Date: 10/09/2024 EXAM: CT UROGRAM 10/09/2024 01:44:45 AM TECHNIQUE: CT of the abdomen and pelvis was performed WITHOUT the administration of intravenous contrast as per CT urogram protocol. Multiplanar reformatted images as well as MIP urogram images are provided for review. Automated exposure control, iterative reconstruction, and/or weight based adjustment of the mA/kV was utilized to reduce the radiation dose to as low as  reasonably achievable. COMPARISON: None available. CLINICAL HISTORY: Abdominal/flank pain, stone suspected. FINDINGS: LOWER CHEST: No acute abnormality. LIVER: The liver is unremarkable. GALLBLADDER AND BILE DUCTS: Gallbladder is unremarkable. No biliary ductal dilatation. SPLEEN: No acute abnormality. PANCREAS: No acute abnormality. ADRENAL GLANDS: No acute abnormality. KIDNEYS, URETERS AND BLADDER: Punctate nonobstructing bilateral nephrolithiasis. No stones in the ureters. No hydronephrosis. No perinephric or periureteral stranding. Possible trace perivesical stranding. Urinary bladder is unremarkable. GI AND BOWEL: Stomach demonstrates no acute abnormality. There is no bowel obstruction. Normal Appendix. PERITONEUM AND RETROPERITONEUM: No ascites. No free air. Mild stranding in the right lower quadrant anterior to the uterus and cecum (series 2 image 57). VASCULATURE: Aorta is normal in caliber. LYMPH NODES: Interval enlargement of bilateral iliac chain lymph nodes. For example, a 12 mm right iliac chain node  on series 2 image 65 previously measured 8 mm, and a 11 mm left iliac chain node on series 2 image 65 previously measured 8 mm. Unchanged 24 mm left paraaortic node on series 2 image 25. REPRODUCTIVE ORGANS: Lobular contour of the uterus likely due to fibroids. Question hazy fat infiltration about the cervix. Debra Vargas: No acute osseous abnormality. No focal soft tissue abnormality. IMPRESSION: 1. Interval enlargement of bilateral iliac chain lymph nodes, with a 12 mm right node and a 20 mm left node. Unchanged 2.4 cm left paraaortic node. 2. Trace hazy fat stranding about the bladder, cervix, and in the right lower quadrant is indeterminate. This may represent an infectious or inflammatory process such as cystitis. Correlate with urinalysis. 3. Given pelvic and retroperitoneal lymphadenopathy, PET / CT may be helpful for further evaluation of these findings. Electronically signed by: Debra Gatlin MD 10/09/2024 02:02 AM EST RP Workstation: [id number]     Procedures   Medications Ordered in the ED  lidocaine  (LIDODERM ) 5 % 1 patch (1 patch Transdermal Patch Applied 10/09/24 0135)  HYDROcodone -acetaminophen  (NORCO/VICODIN) 5-325 MG per tablet 1 tablet (1 tablet Oral Given 10/09/24 0134)  cyclobenzaprine  (FLEXERIL ) tablet 5 mg (5 mg Oral Given 10/09/24 0135)  potassium chloride SA (KLOR-CON M) CR tablet 40 mEq (40 mEq Oral Given 10/09/24 0228)    Clinical Course as of 10/09/24 0259  Wed Oct 09, 2024  0231 Hgb urine dipstick(!): SMALL [JL]    Clinical Course User Index [JL] Debra Agent, MD                                 Medical Decision Making Amount and/or Complexity of Data Reviewed Labs: ordered. Decision-making details documented in ED Course. Radiology: ordered.  Risk Prescription drug management.     53 year old female presenting to the emergency department with flank pain/back pain.  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  She recently got back from a trip and had noticed some flank pain that had resolved.  She denies any injury or trauma to her low back.  She denies any fever or chills.  She denies any history of injection drug use.  She states that she has had pain in the bilateral low back, worse with twisting and movement, no radiation down the legs.  She denies any saddle anesthesia.  She denies any numbness or weakness in the lower extremities.  She denies any dysuria or frequency.  Medical Decision Making:   ORION VANDERVORT is a 53 y.o. female who presented to the ED today with acute lower back pain over the past 7 days, detailed above.      On my initial exam, the pt was with an intact neurologic exam, tolerating ambulation with an antalgic gait and p.o. intake without difficulty.  Patient had mild midline spinal TTP.  Patient endorsing complete sensation of the perineum.  Patient without episodes of fecal or urinary incontinence.  Patient  has no focal neurologic deficits and reassuring vital signs at this time.  No obvious physical abnormality or injury on exam. Notably, patient denies recent trauma, is afebrile, and denies IVDU.   Reviewed and confirmed nursing documentation for past medical history, family history, social history.    Initial Assessment:   With the patient's presentation of acute back pain in the above setting, most likely diagnosis is musculoskeletal strain. Other diagnoses were considered including (but not limited  to) underlying fracture, epidural hematoma, cauda equina syndrome, spinal stenosis, spinal malignancy. These are considered less likely due to history of present illness and physical exam findings.   In particular, lack of fever, substantial history of IV drug use, or substantial neurologic abnormality is less consistent with epidural abscess versus discitis or other spinal infection.   Initial Plan:  Multimodal pain control described and patient informed on safe usage.  Screening evaluation including labs and imaging Patient stable for continued outpatient evaluation and management of their musculoskeletal pains.  Patient referred back to primary care provider for continued evaluation and management.   Initial Study Results:   Radiology  CT Renal Stone Study  Final Result    CT L-SPINE NO CHARGE  Final Result      CT Renal Stone: IMPRESSION:  1. Interval enlargement of bilateral iliac chain lymph nodes, with a 12 mm  right node and a 20 mm left node. Unchanged 2.4 cm left paraaortic node.  2. Trace hazy fat stranding about the bladder, cervix, and in the right lower  quadrant is indeterminate. This may represent an infectious or inflammatory  process such as cystitis. Correlate with urinalysis.  3. Given pelvic and retroperitoneal lymphadenopathy, PET / CT may be helpful  for further evaluation of these findings.   CT L Spine: IMPRESSION:  1. No acute fracture.   Urinalysis with  hematuria, unconvincing for UTI, negative nitrites, negative leukocytes, 0-5 WBCs and only rare bacteria present, CBC without a leukocytosis or anemia, BMP with hypokalemia to 2.9, otherwise unremarkable.  Patient's potassium was replenished orally.  She was provided with Norco and Flexeril  as well as a lidocaine  patch for pain control.  Magnesium was checked and normal.   Disposition:   Based on the above findings, I believe patient is stable for discharge.  I advised that the patient follow-up with her PCP for consideration for outpatient PET/CT given her lymphadenopathy.  Will discharge with a course of pain medication and Flexeril  given her potential musculoskeletal pain however some component of her pain could be due to her CT findings.  No evidence for UTI, patient overall well-appearing and feeling symptomatically improved.  Patient and family educated about specific return precautions for given chief complaint and symptoms.  Patient and family educated about follow-up with PCP.  Patient and family expressed understanding of return precautions and need for follow-up. Patient spoken to regarding all imaging and laboratory results and appropriate follow up for these results. All education provided in verbal and written form and time was allowed for answering of patient questions. Patient discharged.         Final diagnoses:  Abdominal lymphadenopathy  Inguinal lymphadenopathy  Acute bilateral low back pain without sciatica  Hypokalemia    ED Discharge Orders          Ordered    cyclobenzaprine  (FLEXERIL ) 10 MG tablet  2 times daily PRN        10/09/24 0257    HYDROcodone -acetaminophen  (NORCO/VICODIN) 5-325 MG tablet  Every 6 hours PRN        10/09/24 0258               Debra Agent, MD 10/09/24 [phone]

## 2024-10-09 NOTE — Discharge Instructions (Addendum)
 Your CT imaging revealed: IMPRESSION:  1. Interval enlargement of bilateral iliac chain lymph nodes, with a 12 mm  right node and a 20 mm left node. Unchanged 2.4 cm left paraaortic node.  2. Trace hazy fat stranding about the bladder, cervix, and in the right lower  quadrant is indeterminate. This may represent an infectious or inflammatory  process such as cystitis. Correlate with urinalysis.  3. Given pelvic and retroperitoneal lymphadenopathy, PET / CT may be helpful  for further evaluation of these findings.   Do not operate heavy machinery while taking Flexeril  or Norco. Follow-up with your primary care provider for consideration for outpatient PET scan given the findings on CT imaging.  Pain medication and a muscle relaxant has been prescribed.  Return for any severe worsening symptoms.  Red flag symptoms associated back pain would be back pain and fever, back pain with numbness in your groin and an area where you have to sit in a saddle, back pain with urinary or fecal incontinence, back pain with bilateral lower extremity weakness.

## 2024-10-09 NOTE — ED Triage Notes (Signed)
 Pt POV reporting lower back pain x1 week, denies trauma/injury, denies dysuria, afebrile.

## 2024-12-10 ENCOUNTER — Ambulatory Visit: Admitting: Podiatry
